# Patient Record
Sex: Female | Born: 1955 | Race: White | Hispanic: No | Marital: Married | Smoking: Never smoker
Health system: Southern US, Community
[De-identification: ages and names within clinical notes are randomized; demographics above are authoritative.]

## PROBLEM LIST (undated history)

## (undated) DIAGNOSIS — K5090 Crohn's disease, unspecified, without complications: Secondary | ICD-10-CM

## (undated) DIAGNOSIS — C801 Malignant (primary) neoplasm, unspecified: Secondary | ICD-10-CM

## (undated) DIAGNOSIS — E042 Nontoxic multinodular goiter: Secondary | ICD-10-CM

## (undated) DIAGNOSIS — D509 Iron deficiency anemia, unspecified: Secondary | ICD-10-CM

## (undated) HISTORY — DX: Iron deficiency anemia, unspecified: D50.9

## (undated) NOTE — *Deleted (*Deleted)
[facility]  [address] [place], [place] [address] Phone: [phone]  Fax: [phone]   Clinic Day:  11/24/2019  Referring physician: Gayland Curry, MD  Chief Complaint: Donna Hart is a 68 y.o. female with Crohn's disease and iron deficiency anemia who is seen for 1 year assessment.   HPI:  The patient was last seen in the hematology clinic on 11/05/2018 for new patient assessment. At that time, she has been fatigued x 3 weeks.  Exam is unremarkable.  Hematocrit 38.7, hemoglobin 12.2, MCV 83, platelets 323,000, WBC 8,900.  Ferritin was 9 with an iron saturation of 6% and a TIBC of 467. She received Feraheme.  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 There were few shallow ulcers in the body of the pouch. There was a 4 cm segment of moderate inflammation in the pre-pouch ileum just above the pouch inlet (this looked worse than prior).   Screening mammogram on 06/11/2019 revealed no evidence of malignancy.  Labs followed: 02/10/2019: Hematocrit 41.2, hemoglobin 13.1, platelets 296,000, WBC 9,900. Ferritin 27. Iron saturation 12%. TIBC 385. 05/06/2019: Hematocrit 41.5, hemoglobin 13.1, platelets 292,000, WBC 6,500. Ferritin 20. Iron saturation   9%. TIBC 400. 09/24/2019: Hematocrit 39.2, hemoglobin 12.1, platelets 358,000, WBC 8,200. Ferritin 13.  Additional labs: Vitamin B12 was 458 and folate was 35.0 on 02/10/2019.  She received Feraheme on 09/30/2019.  During the interim, ***   Past Medical History:  Diagnosis Date  . Cancer ([place])    thyroid  . Crohn's disease ([place])   . IDA (iron deficiency anemia)   . Multinodular goiter     Past Surgical History:  Procedure Laterality Date  . CESAREAN SECTION      Family History  Problem Relation Age of Onset  . Breast cancer Maternal Aunt 46  Her father was suspected to have an autoimmune disease. Her sister has ulcerative colitis.    Social History:  reports that  she has never smoked. She has never used smokeless tobacco. She reports previous alcohol use. She reports that she does not use drugs.  She drinks about 2 glasses of wine a week. She denies any exposure to radiations or toxins. 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.  She lives in [place].  The patient is alone*** today.  Allergies:  Allergies  Allergen Reactions  . Levofloxacin Other (See Comments)    Achilles tendon pain  . Ciprofloxacin Palpitations    Heart Palpitations  . Morphine Rash    Current Medications: Current Outpatient Medications  Medication Sig Dispense Refill  . acetaminophen (TYLENOL) 500 MG tablet Take 1,000 mg by mouth every 6 (six) hours as needed.     Marland Kitchen amLODipine (NORVASC) 5 MG tablet Take 1 tablet by mouth daily.  11  . BIOTIN PO Take 1 tablet by mouth daily.    . Calcium Carbonate-Vitamin D 600-400 MG-UNIT tablet Take 1 tablet by mouth daily.     . Celecoxib (CELEBREX PO) Take by mouth.    . doxycycline (VIBRA-TABS) 100 MG tablet Take 1 tablet (100 mg total) by mouth 2 (two) times daily. 20 tablet 0  . levothyroxine (SYNTHROID) 137 MCG tablet Take 137 mcg by mouth daily.    Marland Kitchen lisinopril (PRINIVIL,ZESTRIL) 10 MG tablet Take 10 mg by mouth daily.     . Multiple Vitamins-Minerals (MULTIVITAMIN ADULT PO) Take 1 tablet by mouth daily.    . NEOMYCIN-POLYMYXIN-HYDROCORTISONE (CORTISPORIN) 1 % SOLN OTIC solution Apply 1-2 drops to toe BID  after soaking 10 mL 1  . omeprazole (PRILOSEC) 20 MG capsule Take 20 mg by mouth daily.    Marland Kitchen Ustekinumab (STELARA ) Inject 90 mg into the skin every 8 (eight) weeks.      No current facility-administered medications for this visit.    Review of Systems  Constitutional: Positive for malaise/fatigue (x 3-4 weeks). Negative for chills, diaphoresis, fever and weight loss.       Feels "pretty good".  HENT: Negative.  Negative for congestion, ear pain, hearing loss, nosebleeds, sinus pain and sore throat.    Eyes: Negative.  Negative for blurred vision and double vision.  Respiratory: Positive for shortness of breath. Negative for cough, hemoptysis and sputum production.   Cardiovascular: Negative.  Negative for chest pain, palpitations, claudication, leg swelling and PND.  Gastrointestinal: Negative for abdominal pain, blood in stool, constipation, diarrhea, heartburn, melena, nausea and vomiting.       Chron's disease. "Pasty stool" s/p J-pouch surgery (2003).  Genitourinary: Negative for dysuria, frequency, hematuria and urgency.  Musculoskeletal: Positive for joint pain (hips). Negative for back pain, myalgias and neck pain.       Muscle cramps.  Skin: Negative.  Negative for itching and rash.  Neurological: Negative.  Negative for dizziness, tingling, sensory change, focal weakness, weakness and headaches.  Endo/Heme/Allergies: Negative.  Does not bruise/bleed easily.  Psychiatric/Behavioral: Negative.  Negative for depression and memory loss. The patient is not nervous/anxious and does not have insomnia.   All other systems reviewed and are negative.  Performance status (ECOG): 1***  Vitals There were no vitals taken for this visit.  Physical Exam Vitals and nursing note reviewed.  Constitutional:      General: She is not in acute distress.    Appearance: She is well-developed. She is not diaphoretic.  HENT:     Head: Normocephalic and atraumatic.     Mouth/Throat:     Pharynx: No oropharyngeal exudate.  Eyes:     General: No scleral icterus.    Conjunctiva/sclera: Conjunctivae normal.     Pupils: Pupils are equal, round, and reactive to light.     Comments: Glasses.  Blue eyes.  Cardiovascular:     Rate and Rhythm: Normal rate and regular rhythm.     Heart sounds: Normal heart sounds. No murmur heard.  No gallop.   Pulmonary:     Effort: Pulmonary effort is normal. No respiratory distress.     Breath sounds: Normal breath sounds. No wheezing or rales.  Chest:     Chest  wall: No tenderness.  Abdominal:     General: Bowel sounds are normal. There is no distension.     Palpations: Abdomen is soft. There is no mass.     Tenderness: There is no abdominal tenderness. There is no guarding or rebound.  Musculoskeletal:        General: No tenderness. Normal range of motion.     Cervical back: Normal range of motion and neck supple.  Lymphadenopathy:     Head:     Right side of head: No preauricular, posterior auricular or occipital adenopathy.     Left side of head: No preauricular, posterior auricular or occipital adenopathy.     Cervical: No cervical adenopathy.     Upper Body:     Right upper body: No supraclavicular adenopathy.     Left upper body: No supraclavicular adenopathy.     Lower Body: No right inguinal adenopathy. No left inguinal adenopathy.  Skin:    General:  Skin is warm and dry.     Coloration: Skin is not pale.     Findings: No erythema or rash.  Neurological:     Mental Status: She is alert and oriented to person, place, and time.  Psychiatric:        Behavior: Behavior normal.        Thought Content: Thought content normal.        Judgment: Judgment normal.     No visits with results within 3 Day(s) from this visit.  Latest known visit with results is:  Appointment on 09/24/2019  Component Date Value Ref Range Status  . Ferritin 09/24/2019 13  11 - 307 ng/mL Final   Performed at [facility], [address] [place], [place] [address]  . WBC 09/24/2019 8.2  4.0 - 10.5 K/uL Final  . RBC 09/24/2019 4.70  3.87 - 5.11 MIL/uL Final  . Hemoglobin 09/24/2019 12.1  12.0 - 15.0 g/dL Final  . HCT 09/24/2019 39.2  36 - 46 % Final  . MCV 09/24/2019 83.4  80.0 - 100.0 fL Final  . MCH 09/24/2019 25.7* 26.0 - 34.0 pg Final  . MCHC 09/24/2019 30.9  30.0 - 36.0 g/dL Final  . RDW 09/24/2019 15.1  11.5 - 15.5 % Final  . Platelets 09/24/2019 358  150 - 400 K/uL Final  . nRBC 09/24/2019 0.0  0.0 - 0.2 % Final   Performed at [facility], [address], [place], [place] [address]    Assessment:  Karalynn Cottone is a 49 y.o. female with Crohn's disease and recurrent iron deficiency anemia.  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  She has received Feraheme on 07/26/2015, 01/30/2016, 01/04/2017, 01/09/2017, 03/28/2017, 10/18/2017, 11/05/2018, and 09/30/2019.  Ferritin has been followed: 9 on 01/19/2011, 20 on 04/25/2011, 15 on 07/25/2011, 9 on 10/24/2011, 11 on 01/30/2012,  10 on 04/04/2015, 22 on 07/02/2013, 11 on 07/19/2015, 19 on 01/18/2016, 16 on 04/25/2016, 8 on 12/26/2016, 33 on 04/04/2017, 45 on 07/03/2017, 24 on 09/30/2017, 9 on 11/04/2018, 27 on 02/10/2019, 20 on 05/06/2019, and 13 on 09/24/2019.   Pouchoscopy at [facility] on 01/22/2018 revealed anal stricture on digital rectal exam.  Prepouch ileum was normal.  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  There was inflammation at the pouch inlet secondary to pouchitis.  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 There were few shallow ulcers in the body of the pouch. There was a 4 cm segment of moderate inflammation in the pre-pouch ileum just above the pouch inlet (this looked worse than prior).   Symptomatically, ***  Plan: 1.   Review labs   2.   Iron deficiency anemia  Review entire medical history, diagnosis and management of iron deficiency anemia.  Etiology felt secondary to malabsorption.  Patient to follow-up with GI for repeat post-surgical lower GI endoscopy in 6 months (07/23/2018).  Feraheme today. 3.   RTC in 3 months for labs (CBC, ferritin, iron studies, B12, folate). 4.   RTC in 6 months for labs (CBC, ferritin, iron studies). 5.   RTC in 1 year for MD assessment, labs (CBC with diff, ferritin, iron studies- day before), and +/- Feraheme.  I discussed the assessment and treatment plan with the patient.  The patient was  provided an opportunity to ask questions and all were answered.  The patient agreed with the plan and demonstrated an understanding of the instructions.  The patient  was advised to call back if the symptoms worsen or if the condition fails to improve as anticipated.  I provided *** minutes of face-to-face time during this this encounter and > 50% was spent counseling as documented under my assessment and plan.  Melissa C. Mike Gip, MD, PhD    11/24/2019, 9:14 AM  I, Mirian Mo Tufford, am acting as Education administrator for [employer]. Mike Gip, MD, PhD.  I, Melissa C. Mike Gip, MD, have reviewed the above documentation for accuracy and completeness, and I agree with the above.

---

## 2005-12-27 ENCOUNTER — Ambulatory Visit: Payer: Self-pay | Admitting: Family Medicine

## 2007-02-25 ENCOUNTER — Other Ambulatory Visit: Payer: Self-pay

## 2007-02-25 ENCOUNTER — Emergency Department: Payer: Self-pay | Admitting: Emergency Medicine

## 2008-02-11 ENCOUNTER — Ambulatory Visit: Payer: Self-pay | Admitting: Family Medicine

## 2008-03-08 ENCOUNTER — Ambulatory Visit: Payer: Self-pay | Admitting: Family Medicine

## 2008-08-24 ENCOUNTER — Ambulatory Visit: Payer: Self-pay

## 2008-10-06 ENCOUNTER — Ambulatory Visit: Payer: Self-pay | Admitting: Internal Medicine

## 2008-10-08 ENCOUNTER — Ambulatory Visit: Payer: Self-pay | Admitting: Internal Medicine

## 2008-10-11 ENCOUNTER — Ambulatory Visit: Payer: Self-pay

## 2008-10-20 ENCOUNTER — Ambulatory Visit: Payer: Self-pay | Admitting: Family Medicine

## 2008-11-08 ENCOUNTER — Ambulatory Visit: Payer: Self-pay | Admitting: Oncology

## 2008-11-08 ENCOUNTER — Ambulatory Visit: Payer: Self-pay | Admitting: Internal Medicine

## 2008-12-08 ENCOUNTER — Ambulatory Visit: Payer: Self-pay | Admitting: Oncology

## 2008-12-08 ENCOUNTER — Ambulatory Visit: Payer: Self-pay | Admitting: Internal Medicine

## 2009-01-08 ENCOUNTER — Ambulatory Visit: Payer: Self-pay | Admitting: Internal Medicine

## 2009-02-08 ENCOUNTER — Ambulatory Visit: Payer: Self-pay | Admitting: Internal Medicine

## 2009-04-01 ENCOUNTER — Ambulatory Visit: Payer: Self-pay | Admitting: Internal Medicine

## 2009-04-08 ENCOUNTER — Ambulatory Visit: Payer: Self-pay | Admitting: Internal Medicine

## 2009-05-08 ENCOUNTER — Ambulatory Visit: Payer: Self-pay | Admitting: Internal Medicine

## 2009-06-08 ENCOUNTER — Ambulatory Visit: Payer: Self-pay | Admitting: Internal Medicine

## 2009-06-08 ENCOUNTER — Ambulatory Visit: Payer: Self-pay | Admitting: Oncology

## 2009-07-08 ENCOUNTER — Ambulatory Visit: Payer: Self-pay | Admitting: Internal Medicine

## 2009-07-08 ENCOUNTER — Ambulatory Visit: Payer: Self-pay | Admitting: Oncology

## 2009-09-08 ENCOUNTER — Ambulatory Visit: Payer: Self-pay | Admitting: Internal Medicine

## 2009-09-16 ENCOUNTER — Ambulatory Visit: Payer: Self-pay | Admitting: Internal Medicine

## 2009-10-08 ENCOUNTER — Ambulatory Visit: Payer: Self-pay | Admitting: Internal Medicine

## 2009-11-30 ENCOUNTER — Ambulatory Visit: Payer: Self-pay | Admitting: Oncology

## 2009-12-08 ENCOUNTER — Ambulatory Visit: Payer: Self-pay | Admitting: Oncology

## 2010-01-08 ENCOUNTER — Ambulatory Visit: Payer: Self-pay | Admitting: Oncology

## 2010-03-09 ENCOUNTER — Ambulatory Visit: Payer: Self-pay | Admitting: Internal Medicine

## 2010-04-09 ENCOUNTER — Ambulatory Visit: Payer: Self-pay | Admitting: Internal Medicine

## 2010-05-09 ENCOUNTER — Ambulatory Visit: Payer: Self-pay | Admitting: Oncology

## 2010-07-28 ENCOUNTER — Ambulatory Visit: Payer: Self-pay | Admitting: Oncology

## 2010-08-09 ENCOUNTER — Ambulatory Visit: Payer: Self-pay | Admitting: Oncology

## 2010-09-09 ENCOUNTER — Ambulatory Visit: Payer: Self-pay | Admitting: Oncology

## 2011-01-19 ENCOUNTER — Ambulatory Visit: Payer: Self-pay | Admitting: Oncology

## 2011-01-19 LAB — CBC CANCER CENTER
Basophil %: 0.3 %
Eosinophil %: 1.6 %
HCT: 38.2 % (ref 35.0–47.0)
HGB: 12.3 g/dL (ref 12.0–16.0)
Lymphocyte #: 1.8 x10 3/mm (ref 1.0–3.6)
Lymphocyte %: 22 %
MCHC: 32.3 g/dL (ref 32.0–36.0)
MCV: 84.9 fL (ref 80–100)
Monocyte %: 8.4 %
Neutrophil #: 5.6 x10 3/mm (ref 1.4–6.5)
RBC: 4.5 10*6/uL (ref 3.80–5.20)
RDW: 13.3 % (ref 11.5–14.5)
WBC: 8.2 x10 3/mm (ref 3.6–11.0)

## 2011-01-19 LAB — FERRITIN: Ferritin (ARMC): 9 ng/mL (ref 8–388)

## 2011-01-19 LAB — IRON AND TIBC: Iron Saturation: 6 %

## 2011-02-09 ENCOUNTER — Ambulatory Visit: Payer: Self-pay | Admitting: Oncology

## 2011-04-25 ENCOUNTER — Ambulatory Visit: Payer: Self-pay | Admitting: Oncology

## 2011-04-25 LAB — CBC CANCER CENTER
Basophil #: 0.1 x10 3/mm (ref 0.0–0.1)
Basophil %: 0.6 %
Eosinophil #: 0.1 x10 3/mm (ref 0.0–0.7)
Eosinophil %: 1.3 %
HCT: 41.4 % (ref 35.0–47.0)
Lymphocyte #: 1.8 x10 3/mm (ref 1.0–3.6)
Lymphocyte %: 21 %
MCHC: 33.2 g/dL (ref 32.0–36.0)
MCV: 87 fL (ref 80–100)
Monocyte #: 0.8 x10 3/mm (ref 0.2–0.9)
Monocyte %: 9 %
Neutrophil #: 6 x10 3/mm (ref 1.4–6.5)

## 2011-04-26 LAB — IRON AND TIBC
Iron Saturation: 20 %
Iron: 77 ug/dL (ref 50–170)

## 2011-04-26 LAB — FERRITIN: Ferritin (ARMC): 20 ng/mL (ref 8–388)

## 2011-05-09 ENCOUNTER — Ambulatory Visit: Payer: Self-pay | Admitting: Oncology

## 2011-07-25 ENCOUNTER — Ambulatory Visit: Payer: Self-pay | Admitting: Oncology

## 2011-07-25 LAB — CBC CANCER CENTER
Basophil #: 0 x10 3/mm (ref 0.0–0.1)
Basophil %: 0.4 %
Eosinophil %: 1.3 %
HCT: 41.1 % (ref 35.0–47.0)
Lymphocyte #: 2.1 x10 3/mm (ref 1.0–3.6)
MCH: 29.5 pg (ref 26.0–34.0)
MCHC: 33.9 g/dL (ref 32.0–36.0)
Monocyte %: 8.4 %
Neutrophil #: 5.4 x10 3/mm (ref 1.4–6.5)

## 2011-07-25 LAB — FERRITIN: Ferritin (ARMC): 15 ng/mL (ref 8–388)

## 2011-07-25 LAB — IRON AND TIBC
Iron Saturation: 37 %
Iron: 164 ug/dL (ref 50–170)
Unbound Iron-Bind.Cap.: 279 ug/dL

## 2011-08-09 ENCOUNTER — Ambulatory Visit: Payer: Self-pay | Admitting: Oncology

## 2011-10-24 ENCOUNTER — Ambulatory Visit: Payer: Self-pay | Admitting: Oncology

## 2011-10-24 LAB — IRON AND TIBC
Iron Bind.Cap.(Total): 442 ug/dL (ref 250–450)
Iron: 113 ug/dL (ref 50–170)
Unbound Iron-Bind.Cap.: 329 ug/dL

## 2011-10-24 LAB — CBC CANCER CENTER
Eosinophil %: 2.1 %
HCT: 40 % (ref 35.0–47.0)
Lymphocyte #: 1.9 x10 3/mm (ref 1.0–3.6)
Lymphocyte %: 25.6 %
MCV: 87 fL (ref 80–100)
Monocyte %: 9.9 %
Neutrophil #: 4.5 x10 3/mm (ref 1.4–6.5)
Neutrophil %: 61.9 %
Platelet: 285 x10 3/mm (ref 150–440)
RBC: 4.61 10*6/uL (ref 3.80–5.20)
RDW: 14.1 % (ref 11.5–14.5)

## 2011-11-09 ENCOUNTER — Ambulatory Visit: Payer: Self-pay | Admitting: Oncology

## 2011-12-24 DIAGNOSIS — E042 Nontoxic multinodular goiter: Secondary | ICD-10-CM | POA: Insufficient documentation

## 2012-01-30 ENCOUNTER — Ambulatory Visit: Payer: Self-pay | Admitting: Oncology

## 2012-01-30 LAB — CBC CANCER CENTER
Basophil #: 0.1 x10 3/mm (ref 0.0–0.1)
Basophil %: 0.7 %
Eosinophil #: 0.1 x10 3/mm (ref 0.0–0.7)
Eosinophil %: 2 %
HCT: 39.5 % (ref 35.0–47.0)
Lymphocyte #: 1.9 x10 3/mm (ref 1.0–3.6)
Lymphocyte %: 25.9 %
MCHC: 33 g/dL (ref 32.0–36.0)
Monocyte #: 0.8 x10 3/mm (ref 0.2–0.9)
Neutrophil #: 4.4 x10 3/mm (ref 1.4–6.5)
Platelet: 286 x10 3/mm (ref 150–440)
RDW: 15.2 % — ABNORMAL HIGH (ref 11.5–14.5)

## 2012-01-30 LAB — IRON AND TIBC
Iron Saturation: 20 %
Iron: 87 ug/dL (ref 50–170)

## 2012-01-30 LAB — FERRITIN: Ferritin (ARMC): 11 ng/mL (ref 8–388)

## 2012-02-09 ENCOUNTER — Ambulatory Visit: Payer: Self-pay | Admitting: Oncology

## 2012-08-13 DIAGNOSIS — S3993XA Unspecified injury of pelvis, initial encounter: Secondary | ICD-10-CM | POA: Insufficient documentation

## 2012-08-20 ENCOUNTER — Ambulatory Visit: Payer: Self-pay | Admitting: Oncology

## 2012-09-08 ENCOUNTER — Ambulatory Visit: Payer: Self-pay | Admitting: Oncology

## 2012-12-24 DIAGNOSIS — L0291 Cutaneous abscess, unspecified: Secondary | ICD-10-CM | POA: Insufficient documentation

## 2013-01-21 DIAGNOSIS — E079 Disorder of thyroid, unspecified: Secondary | ICD-10-CM | POA: Insufficient documentation

## 2013-04-07 ENCOUNTER — Ambulatory Visit: Payer: Self-pay | Admitting: Oncology

## 2013-04-09 ENCOUNTER — Ambulatory Visit: Payer: Self-pay | Admitting: Oncology

## 2013-05-08 ENCOUNTER — Ambulatory Visit: Payer: Self-pay | Admitting: Oncology

## 2013-07-02 ENCOUNTER — Ambulatory Visit: Payer: Self-pay | Admitting: Oncology

## 2013-07-02 LAB — CBC CANCER CENTER
BASOS ABS: 0.1 x10 3/mm (ref 0.0–0.1)
BASOS PCT: 0.8 %
EOS ABS: 0.2 x10 3/mm (ref 0.0–0.7)
Eosinophil %: 2.2 %
HCT: 43.2 % (ref 35.0–47.0)
HGB: 14.2 g/dL (ref 12.0–16.0)
Lymphocyte #: 1.9 x10 3/mm (ref 1.0–3.6)
Lymphocyte %: 25.5 %
MCH: 29.1 pg (ref 26.0–34.0)
MCHC: 33 g/dL (ref 32.0–36.0)
MCV: 88 fL (ref 80–100)
Monocyte #: 0.8 x10 3/mm (ref 0.2–0.9)
Monocyte %: 11.2 %
Neutrophil #: 4.6 x10 3/mm (ref 1.4–6.5)
Neutrophil %: 60.3 %
PLATELETS: 276 x10 3/mm (ref 150–440)
RBC: 4.89 10*6/uL (ref 3.80–5.20)
RDW: 14.8 % — ABNORMAL HIGH (ref 11.5–14.5)
WBC: 7.5 x10 3/mm (ref 3.6–11.0)

## 2013-07-02 LAB — FERRITIN: FERRITIN (ARMC): 22 ng/mL (ref 8–388)

## 2013-07-02 LAB — IRON AND TIBC
Iron Bind.Cap.(Total): 369 ug/dL (ref 250–450)
Iron Saturation: 16 %
Iron: 59 ug/dL (ref 50–170)
Unbound Iron-Bind.Cap.: 310 ug/dL

## 2013-07-08 ENCOUNTER — Ambulatory Visit: Payer: Self-pay | Admitting: Oncology

## 2013-10-20 DIAGNOSIS — K91850 Pouchitis: Secondary | ICD-10-CM | POA: Insufficient documentation

## 2013-12-01 DIAGNOSIS — K51919 Ulcerative colitis, unspecified with unspecified complications: Secondary | ICD-10-CM | POA: Insufficient documentation

## 2015-04-04 ENCOUNTER — Other Ambulatory Visit: Payer: Self-pay | Admitting: Family Medicine

## 2015-04-04 ENCOUNTER — Ambulatory Visit
Admission: RE | Admit: 2015-04-04 | Discharge: 2015-04-04 | Disposition: A | Discharge status: Home / Self Care | Payer: BLUE CROSS/BLUE SHIELD | Source: Ambulatory Visit | Attending: Family Medicine | Admitting: Family Medicine

## 2015-04-04 DIAGNOSIS — Z1231 Encounter for screening mammogram for malignant neoplasm of breast: Secondary | ICD-10-CM | POA: Diagnosis not present

## 2015-05-20 DIAGNOSIS — I10 Essential (primary) hypertension: Secondary | ICD-10-CM | POA: Insufficient documentation

## 2015-07-19 ENCOUNTER — Other Ambulatory Visit: Payer: BLUE CROSS/BLUE SHIELD

## 2015-07-19 ENCOUNTER — Other Ambulatory Visit: Payer: Self-pay | Admitting: *Deleted

## 2015-07-19 ENCOUNTER — Inpatient Hospital Stay: Payer: BLUE CROSS/BLUE SHIELD | Attending: Oncology

## 2015-07-19 DIAGNOSIS — D509 Iron deficiency anemia, unspecified: Secondary | ICD-10-CM | POA: Insufficient documentation

## 2015-07-19 DIAGNOSIS — Z803 Family history of malignant neoplasm of breast: Secondary | ICD-10-CM | POA: Diagnosis not present

## 2015-07-19 DIAGNOSIS — R5383 Other fatigue: Secondary | ICD-10-CM | POA: Insufficient documentation

## 2015-07-19 DIAGNOSIS — K921 Melena: Secondary | ICD-10-CM | POA: Diagnosis not present

## 2015-07-19 DIAGNOSIS — R0602 Shortness of breath: Secondary | ICD-10-CM | POA: Diagnosis not present

## 2015-07-19 DIAGNOSIS — Z79899 Other long term (current) drug therapy: Secondary | ICD-10-CM | POA: Diagnosis not present

## 2015-07-19 DIAGNOSIS — K50911 Crohn's disease, unspecified, with rectal bleeding: Secondary | ICD-10-CM | POA: Insufficient documentation

## 2015-07-19 DIAGNOSIS — R531 Weakness: Secondary | ICD-10-CM | POA: Diagnosis not present

## 2015-07-19 LAB — CBC WITH DIFFERENTIAL/PLATELET
BASOS ABS: 0 10*3/uL (ref 0–0.1)
BASOS PCT: 1 %
EOS PCT: 4 %
Eosinophils Absolute: 0.3 10*3/uL (ref 0–0.7)
HEMATOCRIT: 39.3 % (ref 35.0–47.0)
Hemoglobin: 12.8 g/dL (ref 12.0–16.0)
LYMPHS PCT: 28 %
Lymphs Abs: 1.7 10*3/uL (ref 1.0–3.6)
MCH: 28.2 pg (ref 26.0–34.0)
MCHC: 32.4 g/dL (ref 32.0–36.0)
MCV: 86.8 fL (ref 80.0–100.0)
Monocytes Absolute: 0.7 10*3/uL (ref 0.2–0.9)
Monocytes Relative: 11 %
NEUTROS ABS: 3.6 10*3/uL (ref 1.4–6.5)
Neutrophils Relative %: 56 %
PLATELETS: 260 10*3/uL (ref 150–440)
RBC: 4.53 MIL/uL (ref 3.80–5.20)
RDW: 14.7 % — AB (ref 11.5–14.5)
WBC: 6.3 10*3/uL (ref 3.6–11.0)

## 2015-07-19 LAB — IRON AND TIBC
IRON: 48 ug/dL (ref 28–170)
Saturation Ratios: 11 % (ref 10.4–31.8)
TIBC: 421 ug/dL (ref 250–450)
UIBC: 373 ug/dL

## 2015-07-19 LAB — FERRITIN: Ferritin: 11 ng/mL (ref 11–307)

## 2015-07-20 ENCOUNTER — Inpatient Hospital Stay: Payer: BLUE CROSS/BLUE SHIELD | Admitting: Oncology

## 2015-07-20 ENCOUNTER — Inpatient Hospital Stay: Payer: BLUE CROSS/BLUE SHIELD

## 2015-07-25 ENCOUNTER — Encounter: Payer: Self-pay | Admitting: Oncology

## 2015-07-25 ENCOUNTER — Inpatient Hospital Stay (HOSPITAL_BASED_OUTPATIENT_CLINIC_OR_DEPARTMENT_OTHER): Payer: BLUE CROSS/BLUE SHIELD | Admitting: Oncology

## 2015-07-25 VITALS — BP 152/87 | HR 76 | Temp 97.9°F | Resp 18 | Wt 288.7 lb

## 2015-07-25 DIAGNOSIS — Z803 Family history of malignant neoplasm of breast: Secondary | ICD-10-CM

## 2015-07-25 DIAGNOSIS — R0602 Shortness of breath: Secondary | ICD-10-CM

## 2015-07-25 DIAGNOSIS — R5383 Other fatigue: Secondary | ICD-10-CM

## 2015-07-25 DIAGNOSIS — D509 Iron deficiency anemia, unspecified: Secondary | ICD-10-CM | POA: Insufficient documentation

## 2015-07-25 DIAGNOSIS — Z79899 Other long term (current) drug therapy: Secondary | ICD-10-CM

## 2015-07-25 DIAGNOSIS — R531 Weakness: Secondary | ICD-10-CM | POA: Diagnosis not present

## 2015-07-25 DIAGNOSIS — K50911 Crohn's disease, unspecified, with rectal bleeding: Secondary | ICD-10-CM

## 2015-07-25 DIAGNOSIS — K921 Melena: Secondary | ICD-10-CM

## 2015-07-25 NOTE — Progress Notes (Signed)
[facility]  [phone] [phone]  ID: Donna Hart OB: 05/16/1955  MR#: [id number]  QPY#:[id number]  Patient Care Team: Gayland Curry, MD as PCP - General (Family Medicine)  CHIEF COMPLAINT: Iron deficiency anemia.  INTERVAL HISTORY: Patient has not been evaluated in clinic and nearly 2 years. She returns to clinic today with cmplaints of increased weakness and fatigue as well as occasional shortness of breath. 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  She denies any chest pain.  She denies any easy bleeding or bruising.  She has no nausea, vomiting, constipation, or diarrhea. She has Crohn's disease therefore does have occasional hematochezia. She has good appetite and has maintained her weight. Patient offers no further specific complaints today.  REVIEW OF SYSTEMS:   Review of Systems  Constitutional: Positive for malaise/fatigue. Negative for fever and weight loss.  Respiratory: Positive for shortness of breath.   Cardiovascular: Negative.  Negative for chest pain.  Gastrointestinal: Positive for blood in stool. Negative for abdominal pain and melena.  Genitourinary: Negative.   Musculoskeletal: Negative.   Neurological: Positive for weakness.  Psychiatric/Behavioral: Negative.     As per HPI. Otherwise, a complete review of systems is negatve.  PAST MEDICAL HISTORY: Past Medical History  Diagnosis Date  . IDA (iron deficiency anemia)     PAST SURGICAL HISTORY: Past Surgical History  Procedure Laterality Date  . Cesarean section      FAMILY HISTORY: Family History  Problem Relation Age of Onset  . Breast cancer Maternal Aunt     mat great aunt       ADVANCED DIRECTIVES:    HEALTH MAINTENANCE: Social History  Substance Use Topics  . Smoking status: Never Smoker   . Smokeless tobacco: Not on file  . Alcohol Use: Not on file     Colonoscopy:  PAP:  Bone density:  Lipid panel:  Allergies    Allergen Reactions  . Levofloxacin Other (See Comments)    Achilles tendon pain  . Ciprofloxacin Palpitations    Heart Palpitations  . Morphine Rash    Current Outpatient Prescriptions  Medication Sig Dispense Refill  . Multiple Vitamins-Minerals (MULTIVITAMIN ADULT PO) Take 1 tablet by mouth daily.    Marland Kitchen omeprazole (PRILOSEC) 20 MG capsule Take 20 mg by mouth daily.    . TURMERIC PO Take 1 tablet by mouth 2 (two) times daily.    Marland Kitchen amLODipine (NORVASC) 5 MG tablet Take 1 tablet by mouth daily.  11  . BIOTIN PO Take 1 tablet by mouth daily.    . Calcium Carbonate-Vitamin D 600-400 MG-UNIT tablet Take 1 tablet by mouth 2 (two) times daily.    . Golimumab 50 MG/0.5ML SOAJ Inject 1 Dose into the skin every 30 (thirty) days.     No current facility-administered medications for this visit.    OBJECTIVE: Filed Vitals:   07/25/15 1131  BP: 152/87  Pulse: 76  Temp: 97.9 F (36.6 C)  Resp: 18     There is no height on file to calculate BMI.    ECOG FS:0 - Asymptomatic  General: Well-developed, well-nourished, no acute distress. Eyes: Pink conjunctiva, anicteric sclera. HEENT: Normocephalic, moist mucous membranes, clear oropharnyx. Lungs: Clear to auscultation bilaterally. Heart: Regular rate and rhythm. No rubs, murmurs, or gallops. Abdomen: Soft, nontender, nondistended. No organomegaly noted, normoactive bowel sounds. Musculoskeletal: No edema, cyanosis, or clubbing. Neuro: Alert, answering all questions appropriately. Cranial nerves grossly intact. Skin: No rashes or petechiae noted. Psych: Normal affect. Lymphatics:  No cervical, calvicular, axillary or inguinal LAD.   LAB RESULTS:  No results found for: NA, K, CL, CO2, GLUCOSE, BUN, CREATININE, CALCIUM, PROT, ALBUMIN, AST, ALT, ALKPHOS, BILITOT, GFRNONAA, GFRAA  Lab Results  Component Value Date   WBC 6.3 07/19/2015   NEUTROABS 3.6 07/19/2015   HGB 12.8 07/19/2015   HCT 39.3 07/19/2015   MCV 86.8 07/19/2015   PLT  260 07/19/2015   Lab Results  Component Value Date   IRON 48 07/19/2015   TIBC 421 07/19/2015   IRONPCTSAT 11 07/19/2015    Lab Results  Component Value Date   FERRITIN 11 07/19/2015     STUDIES: No results found.  ASSESSMENT: Iron deficiency anemia.  PLAN:    1.  Iron deficiency anemia:  Likely secondary to chronic malabsorption and Crohn's disease:  Although patient's hemoglobin is within normal limits, her ferritin and iron stores are borderline low and she a symptomatic. She will receive one infusion of 510 mg IV Feraheme today. Return to clinic in 6 months with repeat laboratory work and further evaluation. She may require maintenance Feraheme in the future.  2.  Crohn's Disease: Continue evaluation and treatment per GI.  Patient expressed understanding and was in agreement with this plan. She also understands that She can call clinic at any time with any questions, concerns, or complaints.    Lloyd Huger, MD   07/25/2015 11:48 AM

## 2015-07-25 NOTE — Progress Notes (Signed)
States is feeling weak, tired with occasional shortness of breath.

## 2015-07-26 ENCOUNTER — Inpatient Hospital Stay: Payer: BLUE CROSS/BLUE SHIELD

## 2015-07-26 VITALS — BP 132/72 | HR 84 | Temp 97.2°F | Resp 18

## 2015-07-26 DIAGNOSIS — D509 Iron deficiency anemia, unspecified: Secondary | ICD-10-CM | POA: Diagnosis not present

## 2015-07-26 MED ORDER — SODIUM CHLORIDE 0.9 % IV SOLN
Freq: Once | INTRAVENOUS | Status: AC
  Administered 2015-07-26: 16:00:00 via INTRAVENOUS
  Filled 2015-07-26: qty 1000

## 2015-07-26 MED ORDER — SODIUM CHLORIDE 0.9 % IV SOLN
510.0000 mg | Freq: Once | INTRAVENOUS | Status: AC
  Administered 2015-07-26: 510 mg via INTRAVENOUS
  Filled 2015-07-26: qty 17

## 2015-10-30 ENCOUNTER — Ambulatory Visit
Admission: EM | Admit: 2015-10-30 | Discharge: 2015-10-30 | Disposition: A | Discharge status: Home / Self Care | Payer: BLUE CROSS/BLUE SHIELD | Attending: Family Medicine | Admitting: Family Medicine

## 2015-10-30 ENCOUNTER — Ambulatory Visit (INDEPENDENT_AMBULATORY_CARE_PROVIDER_SITE_OTHER): Payer: BLUE CROSS/BLUE SHIELD

## 2015-10-30 DIAGNOSIS — J209 Acute bronchitis, unspecified: Secondary | ICD-10-CM

## 2015-10-30 HISTORY — DX: Nontoxic multinodular goiter: E04.2

## 2015-10-30 HISTORY — DX: Crohn's disease, unspecified, without complications: K50.90

## 2015-10-30 MED ORDER — DOXYCYCLINE HYCLATE 100 MG PO CAPS: 100.0000 mg | ORAL_CAPSULE | Freq: Two times a day (BID) | ORAL | 0 refills | Status: DC

## 2015-10-30 NOTE — Discharge Instructions (Signed)
This is likely viral.  Given the immunosuppression, go ahead and start the antibiotic.  Take care  Dr. Lacinda Axon

## 2015-10-30 NOTE — ED Provider Notes (Signed)
[facility]    CSN: [id number] Arrival date & time: 10/30/15  1212  History   Chief Complaint Chief Complaint  Patient presents with  . Cough   HPI 60 year old female with a history of Crohn's disease is currently on immunosuppressants presents with complaints of cough.  Patient states she's had cough since Thursday. She reports associated shortness of breath. Cough is mildly productive of green sputum. 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 She is concerned about underlying pneumonia. No associated fevers or chills. She does note some fatigue. No known exacerbating or relieving factors. No other complaints this time.  Past Medical History:  Diagnosis Date  . Crohn's disease ([place])   . IDA (iron deficiency anemia)   . Multinodular goiter    Patient Active Problem List   Diagnosis Date Noted  . Iron deficiency anemia 07/25/2015   Past Surgical History:  Procedure Laterality Date  . CESAREAN SECTION      OB History    No data available      Home Medications    Prior to Admission medications   Medication Sig Start Date End Date Taking? Authorizing Provider  amLODipine (NORVASC) 5 MG tablet Take 1 tablet by mouth daily. 07/15/15  Yes Historical Provider, MD  BIOTIN PO Take 1 tablet by mouth daily.   Yes Historical Provider, MD  Calcium Carbonate-Vitamin D 600-400 MG-UNIT tablet Take 1 tablet by mouth 2 (two) times daily.   Yes Historical Provider, MD  Golimumab 50 MG/0.5ML SOAJ Inject 1 Dose into the skin every 30 (thirty) days.   Yes Historical Provider, MD  Multiple Vitamins-Minerals (MULTIVITAMIN ADULT PO) Take 1 tablet by mouth daily. 09/24/07  Yes Historical Provider, MD  omeprazole (PRILOSEC) 20 MG capsule Take 20 mg by mouth daily.   Yes Historical Provider, MD  TURMERIC PO Take 1 tablet by mouth 2 (two) times daily.   Yes Historical Provider, MD  doxycycline (VIBRAMYCIN) 100 MG capsule Take 1 capsule (100 mg  total) by mouth 2 (two) times daily. 10/30/15   Coral Spikes, DO    Family History Family History  Problem Relation Age of Onset  . Breast cancer Maternal Aunt     mat great aunt   Social History Social History  Substance Use Topics  . Smoking status: Never Smoker  . Smokeless tobacco: Never Used  . Alcohol use Not on file    Allergies   Levofloxacin; Ciprofloxacin; and Morphine   Review of Systems Review of Systems  Constitutional: Positive for fatigue.  Respiratory: Positive for cough and shortness of breath.   All other systems reviewed and are negative.  Physical Exam Triage Vital Signs ED Triage Vitals  Enc Vitals Group     BP 10/30/15 1323 (!) 169/83     Pulse Rate 10/30/15 1323 89     Resp 10/30/15 1323 18     Temp 10/30/15 1323 97.8 F (36.6 C)     Temp Source 10/30/15 1323 Oral     SpO2 10/30/15 1323 90 %     Weight 10/30/15 1322 250 lb (113.4 kg)     Height 10/30/15 1322 5' 3"  (1.6 m)     Head Circumference --      Peak Flow --      Pain Score 10/30/15 1324 0     Pain Loc --      Pain Edu? --      Excl. in [place]? --  Updated Vital Signs BP (!) 169/83 (BP Location: Left Arm)   Pulse 89   Temp 97.8 F (36.6 C) (Oral)   Resp 18   Ht 5' 3"  (1.6 m)   Wt 250 lb (113.4 kg)   SpO2 90%   BMI 44.29 kg/m   Physical Exam  Constitutional: She is oriented to person, place, and time. She appears well-developed. No distress.  HENT:  Head: Normocephalic and atraumatic.  Mouth/Throat: Oropharynx is clear and moist.  Eyes: Conjunctivae are normal.  Neck: Neck supple.  Cardiovascular: Normal rate and regular rhythm.   Pulmonary/Chest: Effort normal.  Decreased breath sounds in all fields. No adventitious sounds appreciated.  Abdominal: Soft. She exhibits no distension. There is no tenderness.  Musculoskeletal: Normal range of motion.  Neurological: She is alert and oriented to person, place, and time.  Skin: Skin is warm. No rash noted.  Psychiatric: She  has a normal mood and affect.  Vitals reviewed.  UC Treatments / Results  Labs (all labs ordered are listed, but only abnormal results are displayed) Labs Reviewed - No data to display  EKG  EKG Interpretation None       Radiology Dg Chest 2 View  Result Date: 10/30/2015 CLINICAL DATA:  60 year old female with history of cough for the past 3 days. EXAM: CHEST  2 VIEW COMPARISON:  Chest x-ray 02/25/2007. FINDINGS: Mild diffuse peribronchial cuffing. Lung volumes are normal. No consolidative airspace disease. No pleural effusions. No pneumothorax. No pulmonary nodule or mass noted. Pulmonary vasculature and the cardiomediastinal silhouette are within normal limits. Atherosclerosis in the thoracic aorta. IMPRESSION: 1. Mild diffuse peribronchial cuffing, suggestive of an acute bronchitis. 2. Aortic atherosclerosis. Electronically Signed   By: Vinnie Langton M.D.   On: 10/30/2015 14:07    Procedures Procedures (including critical care time)  Medications Ordered in UC Medications - No data to display  Initial Impression / Assessment and Plan / UC Course  I have reviewed the triage vital signs and the nursing notes.  Pertinent labs & imaging results that were available during my care of the patient were reviewed by me and considered in my medical decision making (see chart for details).  Clinical Course  60 year old female presents with complaints of cough. Given exposure and productive cough in the setting of immunosuppression, obtaining chest x-ray.  1420 - Chest x-ray returned revealing signs of acute bronchitis. No pneumonia.  Advised supportive care and use of doxycycline if she fails to improve or worsens.  Final Clinical Impressions(s) / UC Diagnoses   Final diagnoses:  Acute bronchitis, unspecified organism    New Prescriptions Discharge Medication List as of 10/30/2015  2:16 PM    START taking these medications   Details  doxycycline (VIBRAMYCIN) 100 MG capsule  Take 1 capsule (100 mg total) by mouth 2 (two) times daily., Starting Sun 10/30/2015, Normal         Coral Spikes, DO 10/30/15 1422

## 2015-10-30 NOTE — ED Triage Notes (Signed)
Pt c/o being lightheaded and cough since Friday.

## 2016-01-18 ENCOUNTER — Inpatient Hospital Stay: Payer: BLUE CROSS/BLUE SHIELD | Attending: Oncology

## 2016-01-18 DIAGNOSIS — K5090 Crohn's disease, unspecified, without complications: Secondary | ICD-10-CM | POA: Insufficient documentation

## 2016-01-18 DIAGNOSIS — E049 Nontoxic goiter, unspecified: Secondary | ICD-10-CM | POA: Insufficient documentation

## 2016-01-18 DIAGNOSIS — Z79899 Other long term (current) drug therapy: Secondary | ICD-10-CM | POA: Diagnosis not present

## 2016-01-18 DIAGNOSIS — R0602 Shortness of breath: Secondary | ICD-10-CM | POA: Insufficient documentation

## 2016-01-18 DIAGNOSIS — D509 Iron deficiency anemia, unspecified: Secondary | ICD-10-CM | POA: Insufficient documentation

## 2016-01-18 DIAGNOSIS — Z803 Family history of malignant neoplasm of breast: Secondary | ICD-10-CM | POA: Insufficient documentation

## 2016-01-18 LAB — CBC WITH DIFFERENTIAL/PLATELET
BASOS ABS: 0.1 10*3/uL (ref 0–0.1)
Basophils Relative: 1 %
Eosinophils Absolute: 0.3 10*3/uL (ref 0–0.7)
Eosinophils Relative: 4 %
HEMATOCRIT: 40.4 % (ref 35.0–47.0)
Hemoglobin: 13 g/dL (ref 12.0–16.0)
LYMPHS PCT: 31 %
Lymphs Abs: 3.1 10*3/uL (ref 1.0–3.6)
MCH: 26.1 pg (ref 26.0–34.0)
MCHC: 32.3 g/dL (ref 32.0–36.0)
MCV: 80.9 fL (ref 80.0–100.0)
MONO ABS: 1.1 10*3/uL — AB (ref 0.2–0.9)
Monocytes Relative: 11 %
NEUTROS ABS: 5.3 10*3/uL (ref 1.4–6.5)
Neutrophils Relative %: 53 %
Platelets: 322 10*3/uL (ref 150–440)
RBC: 4.99 MIL/uL (ref 3.80–5.20)
RDW: 15.5 % — AB (ref 11.5–14.5)
WBC: 9.9 10*3/uL (ref 3.6–11.0)

## 2016-01-18 LAB — FERRITIN: FERRITIN: 19 ng/mL (ref 11–307)

## 2016-01-18 LAB — IRON AND TIBC
Iron: 21 ug/dL — ABNORMAL LOW (ref 28–170)
Saturation Ratios: 5 % — ABNORMAL LOW (ref 10.4–31.8)
TIBC: 466 ug/dL — ABNORMAL HIGH (ref 250–450)
UIBC: 445 ug/dL

## 2016-01-20 ENCOUNTER — Ambulatory Visit: Payer: BLUE CROSS/BLUE SHIELD

## 2016-01-20 ENCOUNTER — Ambulatory Visit: Payer: BLUE CROSS/BLUE SHIELD | Admitting: Oncology

## 2016-01-25 ENCOUNTER — Other Ambulatory Visit: Payer: BLUE CROSS/BLUE SHIELD

## 2016-01-27 ENCOUNTER — Ambulatory Visit: Payer: BLUE CROSS/BLUE SHIELD

## 2016-01-27 ENCOUNTER — Ambulatory Visit: Payer: BLUE CROSS/BLUE SHIELD | Admitting: Oncology

## 2016-01-30 ENCOUNTER — Inpatient Hospital Stay (HOSPITAL_BASED_OUTPATIENT_CLINIC_OR_DEPARTMENT_OTHER): Payer: BLUE CROSS/BLUE SHIELD | Admitting: Oncology

## 2016-01-30 ENCOUNTER — Inpatient Hospital Stay: Payer: BLUE CROSS/BLUE SHIELD

## 2016-01-30 VITALS — BP 170/75 | HR 82 | Temp 99.1°F | Wt 267.0 lb

## 2016-01-30 DIAGNOSIS — R0602 Shortness of breath: Secondary | ICD-10-CM | POA: Diagnosis not present

## 2016-01-30 DIAGNOSIS — E049 Nontoxic goiter, unspecified: Secondary | ICD-10-CM

## 2016-01-30 DIAGNOSIS — D509 Iron deficiency anemia, unspecified: Secondary | ICD-10-CM

## 2016-01-30 DIAGNOSIS — K5090 Crohn's disease, unspecified, without complications: Secondary | ICD-10-CM

## 2016-01-30 DIAGNOSIS — D649 Anemia, unspecified: Secondary | ICD-10-CM | POA: Insufficient documentation

## 2016-01-30 DIAGNOSIS — D508 Other iron deficiency anemias: Secondary | ICD-10-CM

## 2016-01-30 DIAGNOSIS — Z803 Family history of malignant neoplasm of breast: Secondary | ICD-10-CM

## 2016-01-30 DIAGNOSIS — Z79899 Other long term (current) drug therapy: Secondary | ICD-10-CM

## 2016-01-30 MED ORDER — SODIUM CHLORIDE 0.9 % IV SOLN
Freq: Once | INTRAVENOUS | Status: AC
  Administered 2016-01-30: 15:00:00 via INTRAVENOUS
  Filled 2016-01-30: qty 1000

## 2016-01-30 MED ORDER — FERUMOXYTOL INJECTION 510 MG/17 ML
510.0000 mg | Freq: Once | INTRAVENOUS | Status: AC
  Administered 2016-01-30: 510 mg via INTRAVENOUS
  Filled 2016-01-30: qty 17

## 2016-01-30 NOTE — Progress Notes (Signed)
[place]  Telephone:(336) [phone] Fax:(336) [phone]  ID: Donna Hart OB: [birth year]  MR#: [id number]  KGU#:[id number]  Patient Care Team: Gayland Curry, MD as PCP - General (Family Medicine)  CHIEF COMPLAINT: Iron deficiency anemia.  INTERVAL HISTORY: Patient returns to clinic today for repeat laboratory work and further evaluation. She does not complain of weakness or fatigue today. She continues to have occasional dyspnea on exertion and shortness of breath. 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  She denies any chest pain.  She denies any easy bleeding or bruising.  She has no nausea, vomiting, constipation, or diarrhea. She has Crohn's disease therefore does have occasional hematochezia. She has a good appetite and has maintained her weight. Patient offers no further specific complaints today.  REVIEW OF SYSTEMS:   Review of Systems  Constitutional: Negative for fever, malaise/fatigue and weight loss.  Respiratory: Positive for shortness of breath.   Cardiovascular: Negative.  Negative for chest pain and leg swelling.  Gastrointestinal: Positive for blood in stool. Negative for abdominal pain and melena.  Genitourinary: Negative.   Musculoskeletal: Negative.   Neurological: Negative.  Negative for weakness.  Psychiatric/Behavioral: Negative.  The patient is not nervous/anxious.     As per HPI. Otherwise, a complete review of systems is negative.  PAST MEDICAL HISTORY: Past Medical History:  Diagnosis Date  . Crohn's disease ([place])   . IDA (iron deficiency anemia)   . Multinodular goiter     PAST SURGICAL HISTORY: Past Surgical History:  Procedure Laterality Date  . CESAREAN SECTION      FAMILY HISTORY: Family History  Problem Relation Age of Onset  . Breast cancer Maternal Aunt     mat great aunt       ADVANCED DIRECTIVES:    HEALTH MAINTENANCE: Social History  Substance Use Topics  . Smoking status: Never Smoker    . Smokeless tobacco: Never Used  . Alcohol use Not on file     Colonoscopy:  PAP:  Bone density:  Lipid panel:  Allergies  Allergen Reactions  . Levofloxacin Other (See Comments)    Achilles tendon pain  . Ciprofloxacin Palpitations    Heart Palpitations  . Morphine Rash    Current Outpatient Prescriptions  Medication Sig Dispense Refill  . acetaminophen (TYLENOL) 500 MG tablet Take 1,000 mg by mouth.    Marland Kitchen amLODipine (NORVASC) 5 MG tablet Take 1 tablet by mouth daily.  11  . BIOTIN PO Take 1 tablet by mouth daily.    . Calcium Carbonate-Vitamin D 600-400 MG-UNIT tablet Take 1 tablet by mouth 2 (two) times daily.    . Golimumab 50 MG/0.5ML SOAJ Inject 1 Dose into the skin every 30 (thirty) days.    . Multiple Vitamins-Minerals (MULTIVITAMIN ADULT PO) Take 1 tablet by mouth daily.    Marland Kitchen omeprazole (PRILOSEC) 20 MG capsule Take 20 mg by mouth daily.    . TURMERIC PO Take 1 tablet by mouth 2 (two) times daily.     No current facility-administered medications for this visit.     OBJECTIVE: Vitals:   01/30/16 1441  BP: (!) 170/75  Pulse: 82  Temp: 99.1 F (37.3 C)     Body mass index is 47.29 kg/m.    ECOG FS:0 - Asymptomatic  General: Well-developed, well-nourished, no acute distress. Eyes: Pink conjunctiva, anicteric sclera. Lungs: Clear to auscultation bilaterally. Heart: Regular rate and rhythm. No rubs, murmurs, or gallops. Abdomen: Soft, nontender, nondistended. No organomegaly noted, normoactive bowel sounds. Musculoskeletal: No  edema, cyanosis, or clubbing. Neuro: Alert, answering all questions appropriately. Cranial nerves grossly intact. Skin: No rashes or petechiae noted. Psych: Normal affect.   LAB RESULTS:  No results found for: NA, K, CL, CO2, GLUCOSE, BUN, CREATININE, CALCIUM, PROT, ALBUMIN, AST, ALT, ALKPHOS, BILITOT, GFRNONAA, GFRAA  Lab Results  Component Value Date   WBC 9.9 01/18/2016   NEUTROABS 5.3 01/18/2016   HGB 13.0 01/18/2016   HCT  40.4 01/18/2016   MCV 80.9 01/18/2016   PLT 322 01/18/2016   Lab Results  Component Value Date   IRON 21 (L) 01/18/2016   TIBC 466 (H) 01/18/2016   IRONPCTSAT 5 (L) 01/18/2016    Lab Results  Component Value Date   FERRITIN 19 01/18/2016     STUDIES: No results found.  ASSESSMENT: Iron deficiency anemia.  PLAN:    1.  Iron deficiency anemia:  Likely secondary to chronic malabsorption and Crohn's disease. Patient's hemoglobin continues to be within normal limits, and her iron stores continue to be slightly decreased. Proceed with one infusion of 510 mg IV Feraheme today. Return to clinic in 3 months with repeat laboratory work and further evaluation. She may require maintenance Feraheme in the future.  2.  Crohn's Disease: Continue evaluation and treatment per GI. Next line 3. Hypertension: Patient's blood pressure significantly elevated today. Continue monitoring and treatment per primary care.  Patient expressed understanding and was in agreement with this plan. She also understands that She can call clinic at any time with any questions, concerns, or complaints.    Lloyd Huger, MD   02/01/2016 8:58 AM

## 2016-01-30 NOTE — Progress Notes (Signed)
Patient denies pain or discomfort at this time, BP 170/75

## 2016-04-25 ENCOUNTER — Inpatient Hospital Stay: Payer: BLUE CROSS/BLUE SHIELD | Attending: Hematology and Oncology

## 2016-04-25 DIAGNOSIS — D509 Iron deficiency anemia, unspecified: Secondary | ICD-10-CM | POA: Insufficient documentation

## 2016-04-25 DIAGNOSIS — D508 Other iron deficiency anemias: Secondary | ICD-10-CM

## 2016-04-25 LAB — CBC WITH DIFFERENTIAL/PLATELET
BASOS PCT: 1 %
Basophils Absolute: 0.1 10*3/uL (ref 0–0.1)
EOS PCT: 3 %
Eosinophils Absolute: 0.2 10*3/uL (ref 0–0.7)
HEMATOCRIT: 42 % (ref 35.0–47.0)
Hemoglobin: 13.9 g/dL (ref 12.0–16.0)
Lymphocytes Relative: 27 %
Lymphs Abs: 2.4 10*3/uL (ref 1.0–3.6)
MCH: 28.1 pg (ref 26.0–34.0)
MCHC: 33.1 g/dL (ref 32.0–36.0)
MCV: 84.8 fL (ref 80.0–100.0)
MONO ABS: 1 10*3/uL — AB (ref 0.2–0.9)
Monocytes Relative: 11 %
NEUTROS ABS: 5.4 10*3/uL (ref 1.4–6.5)
Neutrophils Relative %: 58 %
Platelets: 306 10*3/uL (ref 150–440)
RBC: 4.95 MIL/uL (ref 3.80–5.20)
RDW: 17.1 % — AB (ref 11.5–14.5)
WBC: 9.1 10*3/uL (ref 3.6–11.0)

## 2016-04-25 LAB — IRON AND TIBC
IRON: 30 ug/dL (ref 28–170)
Saturation Ratios: 7 % — ABNORMAL LOW (ref 10.4–31.8)
TIBC: 423 ug/dL (ref 250–450)
UIBC: 393 ug/dL

## 2016-04-25 LAB — FERRITIN: Ferritin: 16 ng/mL (ref 11–307)

## 2016-04-26 NOTE — Progress Notes (Deleted)
[place]  Telephone:([phone] Fax:(336) [phone]  ID: Donna Hart OB: [birth year]  MR#: [id number]  [id number]#:[id number]  Patient Care Team: Gayland Curry, MD as PCP - General (Family Medicine)  CHIEF COMPLAINT: Iron deficiency anemia.  INTERVAL HISTORY: Patient returns to clinic today for repeat laboratory work and further evaluation. She does not complain of weakness or fatigue today. She continues to have occasional dyspnea on exertion and shortness of breath. 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  She denies any chest pain.  She denies any easy bleeding or bruising.  She has no nausea, vomiting, constipation, or diarrhea. She has Crohn's disease therefore does have occasional hematochezia. She has a good appetite and has maintained her weight. Patient offers no further specific complaints today.  REVIEW OF SYSTEMS:   Review of Systems  Constitutional: Negative for fever, malaise/fatigue and weight loss.  Respiratory: Positive for shortness of breath.   Cardiovascular: Negative.  Negative for chest pain and leg swelling.  Gastrointestinal: Positive for blood in stool. Negative for abdominal pain and melena.  Genitourinary: Negative.   Musculoskeletal: Negative.   Neurological: Negative.  Negative for weakness.  Psychiatric/Behavioral: Negative.  The patient is not nervous/anxious.     As per HPI. Otherwise, a complete review of systems is negative.  PAST MEDICAL HISTORY: Past Medical History:  Diagnosis Date  . Crohn's disease ([place])   . IDA (iron deficiency anemia)   . Multinodular goiter     PAST SURGICAL HISTORY: Past Surgical History:  Procedure Laterality Date  . CESAREAN SECTION      FAMILY HISTORY: Family History  Problem Relation Age of Onset  . Breast cancer Maternal Aunt     mat great aunt       ADVANCED DIRECTIVES:    HEALTH MAINTENANCE: Social History  Substance Use Topics  . Smoking status: Never Smoker    . Smokeless tobacco: Never Used  . Alcohol use Not on file     Colonoscopy:  PAP:  Bone density:  Lipid panel:  Allergies  Allergen Reactions  . Levofloxacin Other (See Comments)    Achilles tendon pain  . Ciprofloxacin Palpitations    Heart Palpitations  . Morphine Rash    Current Outpatient Prescriptions  Medication Sig Dispense Refill  . acetaminophen (TYLENOL) 500 MG tablet Take 1,000 mg by mouth.    Marland Kitchen amLODipine (NORVASC) 5 MG tablet Take 1 tablet by mouth daily.  11  . BIOTIN PO Take 1 tablet by mouth daily.    . Calcium Carbonate-Vitamin D 600-400 MG-UNIT tablet Take 1 tablet by mouth 2 (two) times daily.    . Golimumab 50 MG/0.5ML SOAJ Inject 1 Dose into the skin every 30 (thirty) days.    . Multiple Vitamins-Minerals (MULTIVITAMIN ADULT PO) Take 1 tablet by mouth daily.    Marland Kitchen omeprazole (PRILOSEC) 20 MG capsule Take 20 mg by mouth daily.    . TURMERIC PO Take 1 tablet by mouth 2 (two) times daily.     No current facility-administered medications for this visit.     OBJECTIVE: There were no vitals filed for this visit.   There is no height or weight on file to calculate BMI.    ECOG FS:0 - Asymptomatic  General: Well-developed, well-nourished, no acute distress. Eyes: Pink conjunctiva, anicteric sclera. Lungs: Clear to auscultation bilaterally. Heart: Regular rate and rhythm. No rubs, murmurs, or gallops. Abdomen: Soft, nontender, nondistended. No organomegaly noted, normoactive bowel sounds. Musculoskeletal: No edema, cyanosis, or clubbing. Neuro: Alert, answering  all questions appropriately. Cranial nerves grossly intact. Skin: No rashes or petechiae noted. Psych: Normal affect.   LAB RESULTS:  No results found for: NA, K, CL, CO2, GLUCOSE, BUN, CREATININE, CALCIUM, PROT, ALBUMIN, AST, ALT, ALKPHOS, BILITOT, GFRNONAA, GFRAA  Lab Results  Component Value Date   WBC 9.1 04/25/2016   NEUTROABS 5.4 04/25/2016   HGB 13.9 04/25/2016   HCT 42.0 04/25/2016    MCV 84.8 04/25/2016   PLT 306 04/25/2016   Lab Results  Component Value Date   IRON 30 04/25/2016   TIBC 423 04/25/2016   IRONPCTSAT 7 (L) 04/25/2016    Lab Results  Component Value Date   FERRITIN 16 04/25/2016     STUDIES: No results found.  ASSESSMENT: Iron deficiency anemia.  PLAN:    1.  Iron deficiency anemia:  Likely secondary to chronic malabsorption and Crohn's disease. Patient's hemoglobin continues to be within normal limits, and her iron stores continue to be slightly decreased. Proceed with one infusion of 510 mg IV Feraheme today. Return to clinic in 3 months with repeat laboratory work and further evaluation. She may require maintenance Feraheme in the future.  2.  Crohn's Disease: Continue evaluation and treatment per GI. Next line 3. Hypertension: Patient's blood pressure significantly elevated today. Continue monitoring and treatment per primary care.  Patient expressed understanding and was in agreement with this plan. She also understands that She can call clinic at any time with any questions, concerns, or complaints.    Lloyd Huger, MD   04/26/2016 12:08 AM

## 2016-04-27 ENCOUNTER — Inpatient Hospital Stay: Payer: BLUE CROSS/BLUE SHIELD | Admitting: Oncology

## 2016-04-27 ENCOUNTER — Ambulatory Visit: Payer: BLUE CROSS/BLUE SHIELD | Admitting: Oncology

## 2016-04-27 ENCOUNTER — Ambulatory Visit: Payer: BLUE CROSS/BLUE SHIELD

## 2016-04-27 ENCOUNTER — Inpatient Hospital Stay: Payer: BLUE CROSS/BLUE SHIELD

## 2016-11-08 DIAGNOSIS — K50013 Crohn's disease of small intestine with fistula: Secondary | ICD-10-CM | POA: Insufficient documentation

## 2016-12-26 ENCOUNTER — Ambulatory Visit
Admission: RE | Admit: 2016-12-26 | Discharge: 2016-12-26 | Disposition: A | Discharge status: Home / Self Care | Payer: BLUE CROSS/BLUE SHIELD | Source: Ambulatory Visit | Attending: Family Medicine | Admitting: Family Medicine

## 2016-12-26 ENCOUNTER — Other Ambulatory Visit: Payer: Self-pay | Admitting: Family Medicine

## 2016-12-26 DIAGNOSIS — Z1231 Encounter for screening mammogram for malignant neoplasm of breast: Secondary | ICD-10-CM | POA: Insufficient documentation

## 2017-01-03 NOTE — Progress Notes (Signed)
[place]  Telephone:(336) [phone] Fax:(336) [phone]  ID: Donna Hart OB: 11/09/1955  MR#: [id number]  GGE#:[id number]  Patient Care Team: Gayland Curry, MD as PCP - General (Family Medicine)  CHIEF COMPLAINT: Iron deficiency anemia.  INTERVAL HISTORY: Patient returns to clinic today for repeat laboratory work and further evaluation. She has noted increasing weakness and fatigue over the past several weeks.  She also reports her husband had a knee replacement yesterday and her 8 year old father had a hip replacement earlier this week. She continues to have occasional dyspnea on exertion and shortness of breath. 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  She denies any chest pain.  She denies any easy bleeding or bruising.  She has no nausea, vomiting, constipation, or diarrhea. She has Crohn's disease therefore does have occasional hematochezia. She has a good appetite and has maintained her weight. Patient offers no further specific complaints today.  REVIEW OF SYSTEMS:   Review of Systems  Constitutional: Positive for malaise/fatigue. Negative for fever and weight loss.  Respiratory: Positive for shortness of breath.   Cardiovascular: Negative.  Negative for chest pain and leg swelling.  Gastrointestinal: Positive for blood in stool. Negative for abdominal pain and melena.  Genitourinary: Negative.  Negative for hematuria.  Musculoskeletal: Negative.   Skin: Negative.  Negative for rash.  Neurological: Positive for weakness.  Psychiatric/Behavioral: Negative.  The patient is not nervous/anxious.     As per HPI. Otherwise, a complete review of systems is negative.  PAST MEDICAL HISTORY: Past Medical History:  Diagnosis Date  . Crohn's disease ([place])   . IDA (iron deficiency anemia)   . Multinodular goiter     PAST SURGICAL HISTORY: Past Surgical History:  Procedure Laterality Date  . CESAREAN SECTION      FAMILY HISTORY: Family  History  Problem Relation Age of Onset  . Breast cancer Maternal Aunt 12  . Breast cancer Paternal Aunt        pat great aunts       ADVANCED DIRECTIVES:    HEALTH MAINTENANCE: Social History   Tobacco Use  . Smoking status: Never Smoker  . Smokeless tobacco: Never Used  Substance Use Topics  . Alcohol use: Not on file  . Drug use: Not on file     Colonoscopy:  PAP:  Bone density:  Lipid panel:  Allergies  Allergen Reactions  . Levofloxacin Other (See Comments)    Achilles tendon pain  . Ciprofloxacin Palpitations    Heart Palpitations  . Morphine Rash    Current Outpatient Medications  Medication Sig Dispense Refill  . acetaminophen (TYLENOL) 500 MG tablet Take 1,000 mg by mouth.    Marland Kitchen amLODipine (NORVASC) 5 MG tablet Take 1 tablet by mouth daily.  11  . BIOTIN PO Take 1 tablet by mouth daily.    . Calcium Carbonate-Vitamin D 600-400 MG-UNIT tablet Take 1 tablet by mouth 2 (two) times daily.    . Multiple Vitamins-Minerals (MULTIVITAMIN ADULT PO) Take 1 tablet by mouth daily.    Marland Kitchen omeprazole (PRILOSEC) 20 MG capsule Take 20 mg by mouth daily.    . TURMERIC PO Take 1 tablet by mouth 2 (two) times daily.    Marland Kitchen Ustekinumab (STELARA [place]) Inject into the skin.     No current facility-administered medications for this visit.     OBJECTIVE: Vitals:   01/04/17 1405  BP: (!) 150/80  Pulse: 80  Resp: 18  Temp: 98.6 F (37 C)     Body  mass index is 49.71 kg/m.    ECOG FS:0 - Asymptomatic  General: Well-developed, well-nourished, no acute distress. Eyes: Pink conjunctiva, anicteric sclera. Lungs: Clear to auscultation bilaterally. Heart: Regular rate and rhythm. No rubs, murmurs, or gallops. Abdomen: Soft, nontender, nondistended. No organomegaly noted, normoactive bowel sounds. Musculoskeletal: No edema, cyanosis, or clubbing. Neuro: Alert, answering all questions appropriately. Cranial nerves grossly intact. Skin: No rashes or petechiae noted. Psych: Normal  affect.   LAB RESULTS:  No results found for: NA, K, CL, CO2, GLUCOSE, BUN, CREATININE, CALCIUM, PROT, ALBUMIN, AST, ALT, ALKPHOS, BILITOT, GFRNONAA, GFRAA  Lab Results  Component Value Date   WBC 9.1 04/25/2016   NEUTROABS 5.4 04/25/2016   HGB 13.9 04/25/2016   HCT 42.0 04/25/2016   MCV 84.8 04/25/2016   PLT 306 04/25/2016   Lab Results  Component Value Date   IRON 30 04/25/2016   TIBC 423 04/25/2016   IRONPCTSAT 7 (L) 04/25/2016    Lab Results  Component Value Date   FERRITIN 16 04/25/2016     STUDIES: Mm Digital Screening Bilateral  Result Date: 12/26/2016 CLINICAL DATA:  Screening. EXAM: DIGITAL SCREENING BILATERAL MAMMOGRAM WITH CAD COMPARISON:  Previous exam(s). ACR Breast Density Category b: There are scattered areas of fibroglandular density. FINDINGS: There are no findings suspicious for malignancy. Images were processed with CAD. IMPRESSION: No mammographic evidence of malignancy. A result letter of this screening mammogram Hart be mailed directly to the patient. RECOMMENDATION: Screening mammogram in one year. (Code:SM-B-01Y) BI-RADS CATEGORY  1: Negative. Electronically Signed   By: Claudie Revering M.D.   On: 12/26/2016 16:37    ASSESSMENT: Iron deficiency anemia.  PLAN:    1.  Iron deficiency anemia:  Likely secondary to chronic malabsorption and Crohn's disease. Patient's hemoglobin and iron stores have trended down, and she is symptomatic.  Proceed with 510 mg IV Feraheme today.  Patient Hart then return to clinic on January 10, 2016 for a second infusion.  Return to clinic in 3 months with repeat laboratory work and further evaluation. She likely Hart require maintenance Feraheme in the future.  2.  Crohn's Disease: Continue evaluation and treatment per GI.  3. Hypertension: Patient's blood pressure is mildly elevated today. Continue monitoring and treatment per primary care.  Approximately 30 minutes spent in discussion of which greater than 50% was  consultation.  Patient expressed understanding and was in agreement with this plan. She also understands that She can call clinic at any time with any questions, concerns, or complaints.    Lloyd Huger, MD   01/04/2017 2:38 PM

## 2017-01-04 ENCOUNTER — Inpatient Hospital Stay: Payer: BLUE CROSS/BLUE SHIELD | Attending: Oncology | Admitting: Oncology

## 2017-01-04 ENCOUNTER — Inpatient Hospital Stay: Payer: BLUE CROSS/BLUE SHIELD

## 2017-01-04 VITALS — BP 133/64 | HR 76 | Temp 97.9°F | Resp 18

## 2017-01-04 VITALS — BP 150/80 | HR 80 | Temp 98.6°F | Resp 18 | Wt 280.6 lb

## 2017-01-04 DIAGNOSIS — E042 Nontoxic multinodular goiter: Secondary | ICD-10-CM | POA: Insufficient documentation

## 2017-01-04 DIAGNOSIS — D509 Iron deficiency anemia, unspecified: Secondary | ICD-10-CM | POA: Diagnosis not present

## 2017-01-04 DIAGNOSIS — R531 Weakness: Secondary | ICD-10-CM

## 2017-01-04 DIAGNOSIS — K5090 Crohn's disease, unspecified, without complications: Secondary | ICD-10-CM | POA: Insufficient documentation

## 2017-01-04 DIAGNOSIS — R5383 Other fatigue: Secondary | ICD-10-CM | POA: Diagnosis not present

## 2017-01-04 DIAGNOSIS — Z79899 Other long term (current) drug therapy: Secondary | ICD-10-CM | POA: Diagnosis not present

## 2017-01-04 DIAGNOSIS — D508 Other iron deficiency anemias: Secondary | ICD-10-CM

## 2017-01-04 DIAGNOSIS — I10 Essential (primary) hypertension: Secondary | ICD-10-CM | POA: Diagnosis not present

## 2017-01-04 DIAGNOSIS — R0602 Shortness of breath: Secondary | ICD-10-CM

## 2017-01-04 DIAGNOSIS — Z803 Family history of malignant neoplasm of breast: Secondary | ICD-10-CM | POA: Diagnosis not present

## 2017-01-04 MED ORDER — SODIUM CHLORIDE 0.9 % IV SOLN
510.0000 mg | Freq: Once | INTRAVENOUS | Status: AC
  Administered 2017-01-04: 510 mg via INTRAVENOUS
  Filled 2017-01-04: qty 17

## 2017-01-04 MED ORDER — FERUMOXYTOL INJECTION 510 MG/17 ML
INTRAVENOUS | Status: AC
  Filled 2017-01-04: qty 17

## 2017-01-04 MED ORDER — SODIUM CHLORIDE 0.9 % IV SOLN
Freq: Once | INTRAVENOUS | Status: AC
  Administered 2017-01-04: 15:00:00 via INTRAVENOUS
  Filled 2017-01-04: qty 1000

## 2017-01-09 ENCOUNTER — Inpatient Hospital Stay: Payer: BLUE CROSS/BLUE SHIELD | Attending: Oncology

## 2017-01-09 VITALS — BP 164/77 | HR 74 | Resp 18

## 2017-01-09 DIAGNOSIS — D509 Iron deficiency anemia, unspecified: Secondary | ICD-10-CM | POA: Diagnosis not present

## 2017-01-09 DIAGNOSIS — Z79899 Other long term (current) drug therapy: Secondary | ICD-10-CM | POA: Diagnosis not present

## 2017-01-09 DIAGNOSIS — D508 Other iron deficiency anemias: Secondary | ICD-10-CM

## 2017-01-09 MED ORDER — SODIUM CHLORIDE 0.9 % IV SOLN
Freq: Once | INTRAVENOUS | Status: AC
  Administered 2017-01-09: 14:00:00 via INTRAVENOUS
  Filled 2017-01-09: qty 1000

## 2017-01-09 MED ORDER — FERUMOXYTOL INJECTION 510 MG/17 ML
INTRAVENOUS | Status: AC
  Filled 2017-01-09: qty 17

## 2017-01-09 MED ORDER — SODIUM CHLORIDE 0.9 % IV SOLN
510.0000 mg | Freq: Once | INTRAVENOUS | Status: AC
  Administered 2017-01-09: 510 mg via INTRAVENOUS
  Filled 2017-01-09: qty 17

## 2017-01-09 NOTE — Patient Instructions (Signed)

## 2017-03-31 NOTE — Progress Notes (Signed)
[place]  Telephone:(336) [phone] Fax:(336) [phone]  ID: Donna Hart OB: September 10, 1955  MR#: [id number]  LOV#:[id number]  Patient Care Team: Gayland Curry, MD as PCP - General (Family Medicine)  CHIEF COMPLAINT: Iron deficiency anemia.  INTERVAL HISTORY: Patient returns to clinic today for repeat laboratory work and further evaluation. She currently feels well and is asymptomatic. 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  She denies any chest pain or shortness of breath.  She denies any easy bleeding or bruising.  She has no nausea, vomiting, constipation, or diarrhea. She has Crohn's disease therefore does have occasional hematochezia. She has a good appetite and has maintained her weight. Patient offers no specific complaints today.  REVIEW OF SYSTEMS:   Review of Systems  Constitutional: Negative.  Negative for fever, malaise/fatigue and weight loss.  Respiratory: Negative.  Negative for cough and shortness of breath.   Cardiovascular: Negative.  Negative for chest pain and leg swelling.  Gastrointestinal: Positive for blood in stool. Negative for abdominal pain and melena.  Genitourinary: Negative.  Negative for hematuria.  Musculoskeletal: Negative.   Skin: Negative.  Negative for rash.  Neurological: Negative.  Negative for sensory change, focal weakness and weakness.  Psychiatric/Behavioral: Negative.  The patient is not nervous/anxious.     As per HPI. Otherwise, a complete review of systems is negative.  PAST MEDICAL HISTORY: Past Medical History:  Diagnosis Date  . Crohn's disease ([place])   . IDA (iron deficiency anemia)   . Multinodular goiter     PAST SURGICAL HISTORY: Past Surgical History:  Procedure Laterality Date  . CESAREAN SECTION      FAMILY HISTORY: Family History  Problem Relation Age of Onset  . Breast cancer Maternal Aunt 44  . Breast cancer Paternal Aunt        pat great aunts       ADVANCED  DIRECTIVES:    HEALTH MAINTENANCE: Social History   Tobacco Use  . Smoking status: Never Smoker  . Smokeless tobacco: Never Used  Substance Use Topics  . Alcohol use: Not on file  . Drug use: Not on file     Colonoscopy:  PAP:  Bone density:  Lipid panel:  Allergies  Allergen Reactions  . Levofloxacin Other (See Comments)    Achilles tendon pain  . Ciprofloxacin Palpitations    Heart Palpitations  . Morphine Rash    Current Outpatient Medications  Medication Sig Dispense Refill  . acetaminophen (TYLENOL) 500 MG tablet Take 1,000 mg by mouth.    Marland Kitchen amLODipine (NORVASC) 5 MG tablet Take 1 tablet by mouth daily.  11  . BIOTIN PO Take 1 tablet by mouth daily.    . Calcium Carbonate-Vitamin D 600-400 MG-UNIT tablet Take 1 tablet by mouth 2 (two) times daily.    . Multiple Vitamins-Minerals (MULTIVITAMIN ADULT PO) Take 1 tablet by mouth daily.    Marland Kitchen omeprazole (PRILOSEC) 20 MG capsule Take 20 mg by mouth daily.    . TURMERIC PO Take 1 tablet by mouth 2 (two) times daily.    Marland Kitchen Ustekinumab (STELARA [place]) Inject into the skin.     No current facility-administered medications for this visit.     OBJECTIVE: Vitals:   04/05/17 0928  BP: (!) 153/65  Pulse: 81  Resp: 20  Temp: 98.6 F (37 C)     Body mass index is 50.22 kg/m.    ECOG FS:0 - Asymptomatic  General: Well-developed, well-nourished, no acute distress. Eyes: Pink conjunctiva, anicteric sclera. Lungs:  Clear to auscultation bilaterally. Heart: Regular rate and rhythm. No rubs, murmurs, or gallops. Abdomen: Soft, nontender, nondistended. No organomegaly noted, normoactive bowel sounds. Musculoskeletal: No edema, cyanosis, or clubbing. Neuro: Alert, answering all questions appropriately. Cranial nerves grossly intact. Skin: No rashes or petechiae noted. Psych: Normal affect.   LAB RESULTS:  No results found for: NA, K, CL, CO2, GLUCOSE, BUN, CREATININE, CALCIUM, PROT, ALBUMIN, AST, ALT, ALKPHOS, BILITOT,  GFRNONAA, GFRAA  Lab Results  Component Value Date   WBC 9.1 04/04/2017   NEUTROABS 5.6 04/04/2017   HGB 13.5 04/04/2017   HCT 39.5 04/04/2017   MCV 83.9 04/04/2017   PLT 305 04/04/2017   Lab Results  Component Value Date   IRON 37 04/04/2017   TIBC 391 04/04/2017   IRONPCTSAT 10 (L) 04/04/2017    Lab Results  Component Value Date   FERRITIN 33 04/04/2017     STUDIES: No results found.  ASSESSMENT: Iron deficiency anemia.  PLAN:    1.  Iron deficiency anemia:  Likely secondary to chronic malabsorption and Crohn's disease. Patient's hemoglobin is within normal limits.  She has a slightly decreased iron saturation therefore will proceed with one infusion of 510 mg IV Feraheme.  She does not require second infusion. Return to clinic in 3 months with repeat laboratory work and further evaluation. She likely will require maintenance Feraheme in the future.  2. Crohn's Disease: Continue evaluation and treatment per GI.  3. Hypertension: Patient's blood pressure is mildly elevated today. Continue monitoring and treatment per primary care.  Approximately 30 minutes spent in discussion of which greater than 50% was consultation.  Patient expressed understanding and was in agreement with this plan. She also understands that She can call clinic at any time with any questions, concerns, or complaints.    Lloyd Huger, MD   04/09/2017 1:37 AM

## 2017-04-03 ENCOUNTER — Inpatient Hospital Stay: Payer: BLUE CROSS/BLUE SHIELD

## 2017-04-04 ENCOUNTER — Inpatient Hospital Stay: Payer: BLUE CROSS/BLUE SHIELD | Attending: Oncology

## 2017-04-04 DIAGNOSIS — I10 Essential (primary) hypertension: Secondary | ICD-10-CM | POA: Diagnosis not present

## 2017-04-04 DIAGNOSIS — K5090 Crohn's disease, unspecified, without complications: Secondary | ICD-10-CM | POA: Insufficient documentation

## 2017-04-04 DIAGNOSIS — Z79899 Other long term (current) drug therapy: Secondary | ICD-10-CM | POA: Diagnosis not present

## 2017-04-04 DIAGNOSIS — D509 Iron deficiency anemia, unspecified: Secondary | ICD-10-CM | POA: Insufficient documentation

## 2017-04-04 DIAGNOSIS — D508 Other iron deficiency anemias: Secondary | ICD-10-CM

## 2017-04-04 LAB — CBC WITH DIFFERENTIAL/PLATELET
BASOS ABS: 0 10*3/uL (ref 0–0.1)
Basophils Relative: 1 %
EOS PCT: 3 %
Eosinophils Absolute: 0.3 10*3/uL (ref 0–0.7)
HCT: 39.5 % (ref 35.0–47.0)
HEMOGLOBIN: 13.5 g/dL (ref 12.0–16.0)
LYMPHS ABS: 2.4 10*3/uL (ref 1.0–3.6)
LYMPHS PCT: 26 %
MCH: 28.6 pg (ref 26.0–34.0)
MCHC: 34.1 g/dL (ref 32.0–36.0)
MCV: 83.9 fL (ref 80.0–100.0)
MONO ABS: 0.7 10*3/uL (ref 0.2–0.9)
MONOS PCT: 8 %
NEUTROS ABS: 5.6 10*3/uL (ref 1.4–6.5)
Neutrophils Relative %: 62 %
Platelets: 305 10*3/uL (ref 150–440)
RBC: 4.71 MIL/uL (ref 3.80–5.20)
RDW: 17.2 % — ABNORMAL HIGH (ref 11.5–14.5)
WBC: 9.1 10*3/uL (ref 3.6–11.0)

## 2017-04-04 LAB — IRON AND TIBC
IRON: 37 ug/dL (ref 28–170)
Saturation Ratios: 10 % — ABNORMAL LOW (ref 10.4–31.8)
TIBC: 391 ug/dL (ref 250–450)
UIBC: 354 ug/dL

## 2017-04-04 LAB — FERRITIN: Ferritin: 33 ng/mL (ref 11–307)

## 2017-04-05 ENCOUNTER — Inpatient Hospital Stay: Payer: BLUE CROSS/BLUE SHIELD

## 2017-04-05 ENCOUNTER — Inpatient Hospital Stay: Payer: BLUE CROSS/BLUE SHIELD | Admitting: Oncology

## 2017-04-05 VITALS — BP 142/73 | HR 68 | Resp 20

## 2017-04-05 VITALS — BP 153/65 | HR 81 | Temp 98.6°F | Resp 20 | Wt 283.5 lb

## 2017-04-05 DIAGNOSIS — D508 Other iron deficiency anemias: Secondary | ICD-10-CM

## 2017-04-05 DIAGNOSIS — K5090 Crohn's disease, unspecified, without complications: Secondary | ICD-10-CM

## 2017-04-05 DIAGNOSIS — D509 Iron deficiency anemia, unspecified: Secondary | ICD-10-CM

## 2017-04-05 DIAGNOSIS — I10 Essential (primary) hypertension: Secondary | ICD-10-CM

## 2017-04-05 MED ORDER — SODIUM CHLORIDE 0.9 % IV SOLN
Freq: Once | INTRAVENOUS | Status: AC
  Administered 2017-04-05: 10:00:00 via INTRAVENOUS
  Filled 2017-04-05: qty 1000

## 2017-04-05 MED ORDER — SODIUM CHLORIDE 0.9 % IV SOLN
510.0000 mg | Freq: Once | INTRAVENOUS | Status: AC
  Administered 2017-04-05: 510 mg via INTRAVENOUS
  Filled 2017-04-05: qty 17

## 2017-04-05 NOTE — Patient Instructions (Signed)

## 2017-04-05 NOTE — Progress Notes (Signed)
Patient denies any concerns today.  

## 2017-05-08 DIAGNOSIS — Z6841 Body Mass Index (BMI) 40.0 and over, adult: Secondary | ICD-10-CM | POA: Insufficient documentation

## 2017-05-08 DIAGNOSIS — R011 Cardiac murmur, unspecified: Secondary | ICD-10-CM | POA: Insufficient documentation

## 2017-07-05 ENCOUNTER — Other Ambulatory Visit: Payer: BLUE CROSS/BLUE SHIELD

## 2017-07-12 ENCOUNTER — Other Ambulatory Visit: Payer: Self-pay | Admitting: *Deleted

## 2017-07-12 ENCOUNTER — Ambulatory Visit: Payer: BLUE CROSS/BLUE SHIELD | Admitting: Oncology

## 2017-07-12 ENCOUNTER — Ambulatory Visit: Payer: BLUE CROSS/BLUE SHIELD

## 2017-07-12 DIAGNOSIS — D649 Anemia, unspecified: Secondary | ICD-10-CM

## 2017-07-14 NOTE — Progress Notes (Deleted)
[place]  [phone] [phone]  ID: Seriah Brotzman OB: Jun 01, 1955  MR#: [id number]  VEH#:[id number]  Patient Care Team: Gayland Curry, MD as PCP - General (Family Medicine)  CHIEF COMPLAINT: Iron deficiency anemia.  INTERVAL HISTORY: Patient returns to clinic today for repeat laboratory work and further evaluation. She currently feels well and is asymptomatic. 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  She denies any chest pain or shortness of breath.  She denies any easy bleeding or bruising.  She has no nausea, vomiting, constipation, or diarrhea. She has Crohn's disease therefore does have occasional hematochezia. She has a good appetite and has maintained her weight. Patient offers no specific complaints today.  REVIEW OF SYSTEMS:   Review of Systems  Constitutional: Negative.  Negative for fever, malaise/fatigue and weight loss.  Respiratory: Negative.  Negative for cough and shortness of breath.   Cardiovascular: Negative.  Negative for chest pain and leg swelling.  Gastrointestinal: Positive for blood in stool. Negative for abdominal pain and melena.  Genitourinary: Negative.  Negative for hematuria.  Musculoskeletal: Negative.   Skin: Negative.  Negative for rash.  Neurological: Negative.  Negative for sensory change, focal weakness and weakness.  Psychiatric/Behavioral: Negative.  The patient is not nervous/anxious.     As per HPI. Otherwise, a complete review of systems is negative.  PAST MEDICAL HISTORY: Past Medical History:  Diagnosis Date  . Crohn's disease ([place])   . IDA (iron deficiency anemia)   . Multinodular goiter     PAST SURGICAL HISTORY: Past Surgical History:  Procedure Laterality Date  . CESAREAN SECTION      FAMILY HISTORY: Family History  Problem Relation Age of Onset  . Breast cancer Maternal Aunt 38  . Breast cancer Paternal Aunt        pat great aunts       ADVANCED  DIRECTIVES:    HEALTH MAINTENANCE: Social History   Tobacco Use  . Smoking status: Never Smoker  . Smokeless tobacco: Never Used  Substance Use Topics  . Alcohol use: Not on file  . Drug use: Not on file     Colonoscopy:  PAP:  Bone density:  Lipid panel:  Allergies  Allergen Reactions  . Levofloxacin Other (See Comments)    Achilles tendon pain  . Ciprofloxacin Palpitations    Heart Palpitations  . Morphine Rash    Current Outpatient Medications  Medication Sig Dispense Refill  . acetaminophen (TYLENOL) 500 MG tablet Take 1,000 mg by mouth.    Marland Kitchen amLODipine (NORVASC) 5 MG tablet Take 1 tablet by mouth daily.  11  . BIOTIN PO Take 1 tablet by mouth daily.    . Calcium Carbonate-Vitamin D 600-400 MG-UNIT tablet Take 1 tablet by mouth 2 (two) times daily.    . Multiple Vitamins-Minerals (MULTIVITAMIN ADULT PO) Take 1 tablet by mouth daily.    Marland Kitchen omeprazole (PRILOSEC) 20 MG capsule Take 20 mg by mouth daily.    . TURMERIC PO Take 1 tablet by mouth 2 (two) times daily.    Marland Kitchen Ustekinumab (STELARA [place] [facility]) Inject into the skin.     No current facility-administered medications for this visit.     OBJECTIVE: There were no vitals filed for this visit.   There is no height or weight on file to calculate BMI.    ECOG FS:0 - Asymptomatic  General: Well-developed, well-nourished, no acute distress. Eyes: Pink conjunctiva, anicteric sclera. Lungs: Clear to auscultation bilaterally. Heart: Regular rate and rhythm. No  rubs, murmurs, or gallops. Abdomen: Soft, nontender, nondistended. No organomegaly noted, normoactive bowel sounds. Musculoskeletal: No edema, cyanosis, or clubbing. Neuro: Alert, answering all questions appropriately. Cranial nerves grossly intact. Skin: No rashes or petechiae noted. Psych: Normal affect.   LAB RESULTS:  No results found for: NA, K, CL, CO2, GLUCOSE, BUN, CREATININE, CALCIUM, PROT, ALBUMIN, AST, ALT, ALKPHOS, BILITOT, GFRNONAA, GFRAA  Lab Results    Component Value Date   WBC 9.1 04/04/2017   NEUTROABS 5.6 04/04/2017   HGB 13.5 04/04/2017   HCT 39.5 04/04/2017   MCV 83.9 04/04/2017   PLT 305 04/04/2017   Lab Results  Component Value Date   IRON 37 04/04/2017   TIBC 391 04/04/2017   IRONPCTSAT 10 (L) 04/04/2017    Lab Results  Component Value Date   FERRITIN 33 04/04/2017     STUDIES: No results found.  ASSESSMENT: Iron deficiency anemia.  PLAN:    1.  Iron deficiency anemia:  Likely secondary to chronic malabsorption and Crohn's disease. Patient's hemoglobin is within normal limits.  She has a slightly decreased iron saturation therefore will proceed with one infusion of 510 mg IV Feraheme.  She does not require second infusion. Return to clinic in 3 months with repeat laboratory work and further evaluation. She likely will require maintenance Feraheme in the future.  2. Crohn's Disease: Continue evaluation and treatment per GI.  3. Hypertension: Patient's blood pressure is mildly elevated today. Continue monitoring and treatment per primary care.  Approximately 30 minutes spent in discussion of which greater than 50% was consultation.  Patient expressed understanding and was in agreement with this plan. She also understands that She can call clinic at any time with any questions, concerns, or complaints.    Lloyd Huger, MD   07/14/2017 8:35 AM

## 2017-07-18 ENCOUNTER — Inpatient Hospital Stay: Payer: BLUE CROSS/BLUE SHIELD

## 2017-07-19 ENCOUNTER — Inpatient Hospital Stay: Payer: BLUE CROSS/BLUE SHIELD | Admitting: Oncology

## 2017-07-19 ENCOUNTER — Inpatient Hospital Stay: Payer: BLUE CROSS/BLUE SHIELD

## 2017-10-14 NOTE — Progress Notes (Signed)
[facility]  [phone]) [phone] [phone]  ID: Donna Hart OB: 07-May-1955  MR#: [id number]  EQA#:[id number]  Patient Care Team: Gayland Curry, MD as PCP - General (Family Medicine)  CHIEF COMPLAINT: Iron deficiency anemia.  INTERVAL HISTORY: Patient returns to clinic today for repeat laboratory work, further evaluation, and consideration of IV Feraheme.  She currently feels well and is asymptomatic.  She continues to be active and work full-time.  She is planning a trip to the [place] in the next several weeks.  She has no neurologic complaints.  She denies any chest pain or shortness of breath.  She denies any easy bleeding or bruising.  She has no nausea, vomiting, constipation, or diarrhea. She has Crohn's disease therefore does have occasional hematochezia.  She has no urinary complaints.  Patient feels at her baseline offers no specific complaints today.    REVIEW OF SYSTEMS:   Review of Systems  Constitutional: Negative.  Negative for fever, malaise/fatigue and weight loss.  Respiratory: Negative.  Negative for cough and shortness of breath.   Cardiovascular: Negative.  Negative for chest pain and leg swelling.  Gastrointestinal: Positive for blood in stool. Negative for abdominal pain and melena.  Genitourinary: Negative.  Negative for hematuria.  Musculoskeletal: Negative.  Negative for myalgias.  Skin: Negative.  Negative for rash.  Neurological: Negative.  Negative for sensory change, focal weakness, weakness and headaches.  Psychiatric/Behavioral: Negative.  The patient is not nervous/anxious.     As per HPI. Otherwise, a complete review of systems is negative.  PAST MEDICAL HISTORY: Past Medical History:  Diagnosis Date  . Crohn's disease ([place])   . IDA (iron deficiency anemia)   . Multinodular goiter     PAST SURGICAL HISTORY: Past Surgical History:  Procedure Laterality Date  . CESAREAN SECTION      FAMILY  HISTORY: Family History  Problem Relation Age of Onset  . Breast cancer Maternal Aunt 37  . Breast cancer Paternal Aunt        pat great aunts       ADVANCED DIRECTIVES:    HEALTH MAINTENANCE: Social History   Tobacco Use  . Smoking status: Never Smoker  . Smokeless tobacco: Never Used  Substance Use Topics  . Alcohol use: Not on file  . Drug use: Not on file     Colonoscopy:  PAP:  Bone density:  Lipid panel:  Allergies  Allergen Reactions  . Levofloxacin Other (See Comments)    Achilles tendon pain  . Ciprofloxacin Palpitations    Heart Palpitations  . Morphine Rash    Current Outpatient Medications  Medication Sig Dispense Refill  . acetaminophen (TYLENOL) 500 MG tablet Take 1,000 mg by mouth.    Marland Kitchen amLODipine (NORVASC) 5 MG tablet Take 1 tablet by mouth daily.  11  . amoxicillin-clavulanate (AUGMENTIN) 875-125 MG tablet Take 1 tablet by mouth 2 (two) times daily.    Marland Kitchen BIOTIN PO Take 1 tablet by mouth daily.    . Calcium Carbonate-Vitamin D 600-400 MG-UNIT tablet Take 1 tablet by mouth 2 (two) times daily.    . Multiple Vitamins-Minerals (MULTIVITAMIN ADULT PO) Take 1 tablet by mouth daily.    Marland Kitchen omeprazole (PRILOSEC) 20 MG capsule Take 20 mg by mouth daily.    . TURMERIC PO Take 1 tablet by mouth 2 (two) times daily.    Marland Kitchen Ustekinumab (STELARA [place]) Inject into the skin.     No current facility-administered medications for this visit.     OBJECTIVE:  Vitals:   10/18/17 0922  BP: (!) 150/74  Pulse: 76  Resp: 20  Temp: (!) 97.3 F (36.3 C)     Body mass index is 49.87 kg/m.    ECOG FS:0 - Asymptomatic  General: Well-developed, well-nourished, no acute distress. Eyes: Pink conjunctiva, anicteric sclera. HEENT: Normocephalic, moist mucous membranes. Lungs: Clear to auscultation bilaterally. Heart: Regular rate and rhythm. No rubs, murmurs, or gallops. Abdomen: Soft, nontender, nondistended. No organomegaly noted, normoactive bowel  sounds. Musculoskeletal: No edema, cyanosis, or clubbing. Neuro: Alert, answering all questions appropriately. Cranial nerves grossly intact. Skin: No rashes or petechiae noted. Psych: Normal affect.   LAB RESULTS:  No results found for: NA, K, CL, CO2, GLUCOSE, BUN, CREATININE, CALCIUM, PROT, ALBUMIN, AST, ALT, ALKPHOS, BILITOT, GFRNONAA, GFRAA  Lab Results  Component Value Date   WBC 9.1 04/04/2017   NEUTROABS 5.6 04/04/2017   HGB 13.5 04/04/2017   HCT 39.5 04/04/2017   MCV 83.9 04/04/2017   PLT 305 04/04/2017   Lab Results  Component Value Date   IRON 37 04/04/2017   TIBC 391 04/04/2017   IRONPCTSAT 10 (L) 04/04/2017    Lab Results  Component Value Date   FERRITIN 33 04/04/2017     STUDIES: No results found.  ASSESSMENT: Iron deficiency anemia.  PLAN:    1.  Iron deficiency anemia:  Likely secondary to chronic malabsorption and Crohn's disease.  Patient's hemoglobin continues to be within normal limits, but her iron saturation remains decreased.  Proceed with one infusion of 510 mg IV Feraheme today.  She does not require second infusion.  Return to clinic in 3 months with repeat laboratory work and further evaluation.   2. Crohn's Disease: Chronic and unchanged.  Continue evaluation and treatment per GI.  3. Hypertension: Blood pressure is mildly elevated today.  Continue monitoring and treatment per primary care.  Patient expressed understanding and was in agreement with this plan. She also understands that She can call clinic at any time with any questions, concerns, or complaints.    Lloyd Huger, MD   10/19/2017 8:26 AM

## 2017-10-17 ENCOUNTER — Other Ambulatory Visit: Payer: BLUE CROSS/BLUE SHIELD

## 2017-10-18 ENCOUNTER — Inpatient Hospital Stay: Payer: BLUE CROSS/BLUE SHIELD | Attending: Oncology | Admitting: Oncology

## 2017-10-18 ENCOUNTER — Encounter: Payer: Self-pay | Admitting: Oncology

## 2017-10-18 ENCOUNTER — Inpatient Hospital Stay: Payer: BLUE CROSS/BLUE SHIELD

## 2017-10-18 VITALS — BP 150/74 | HR 76 | Temp 97.3°F | Resp 20 | Wt 281.5 lb

## 2017-10-18 VITALS — BP 141/84 | HR 67 | Resp 20

## 2017-10-18 DIAGNOSIS — K5090 Crohn's disease, unspecified, without complications: Secondary | ICD-10-CM | POA: Diagnosis not present

## 2017-10-18 DIAGNOSIS — Z79899 Other long term (current) drug therapy: Secondary | ICD-10-CM | POA: Insufficient documentation

## 2017-10-18 DIAGNOSIS — I10 Essential (primary) hypertension: Secondary | ICD-10-CM | POA: Diagnosis not present

## 2017-10-18 DIAGNOSIS — D509 Iron deficiency anemia, unspecified: Secondary | ICD-10-CM | POA: Diagnosis present

## 2017-10-18 DIAGNOSIS — D508 Other iron deficiency anemias: Secondary | ICD-10-CM

## 2017-10-18 MED ORDER — SODIUM CHLORIDE 0.9 % IV SOLN
510.0000 mg | Freq: Once | INTRAVENOUS | Status: AC
  Administered 2017-10-18: 510 mg via INTRAVENOUS
  Filled 2017-10-18: qty 17

## 2017-10-18 MED ORDER — SODIUM CHLORIDE 0.9 % IV SOLN
Freq: Once | INTRAVENOUS | Status: AC
  Administered 2017-10-18: 10:00:00 via INTRAVENOUS
  Filled 2017-10-18: qty 250

## 2017-10-18 NOTE — Patient Instructions (Signed)

## 2017-10-18 NOTE — Progress Notes (Signed)
Patient denies any concerns today.  

## 2018-01-10 NOTE — Progress Notes (Deleted)
[place]  [phone]) [phone] [phone]  ID: Donna Hart OB: 1955-09-24  MR#: [id number]  IZT#:[id number]  Patient Care Team: Gayland Curry, MD as PCP - General (Family Medicine)  CHIEF COMPLAINT: Iron deficiency anemia.  INTERVAL HISTORY: Patient returns to clinic today for repeat laboratory work, further evaluation, and consideration of IV Feraheme.  She currently feels well and is asymptomatic.  She continues to be active and work full-time.  She is planning a trip to the [place] in the next several weeks.  She has no neurologic complaints.  She denies any chest pain or shortness of breath.  She denies any easy bleeding or bruising.  She has no nausea, vomiting, constipation, or diarrhea. She has Crohn's disease therefore does have occasional hematochezia.  She has no urinary complaints.  Patient feels at her baseline offers no specific complaints today.    REVIEW OF SYSTEMS:   Review of Systems  Constitutional: Negative.  Negative for fever, malaise/fatigue and weight loss.  Respiratory: Negative.  Negative for cough and shortness of breath.   Cardiovascular: Negative.  Negative for chest pain and leg swelling.  Gastrointestinal: Positive for blood in stool. Negative for abdominal pain and melena.  Genitourinary: Negative.  Negative for hematuria.  Musculoskeletal: Negative.  Negative for myalgias.  Skin: Negative.  Negative for rash.  Neurological: Negative.  Negative for sensory change, focal weakness, weakness and headaches.  Psychiatric/Behavioral: Negative.  The patient is not nervous/anxious.     As per HPI. Otherwise, a complete review of systems is negative.  PAST MEDICAL HISTORY: Past Medical History:  Diagnosis Date  . Crohn's disease ([place])   . IDA (iron deficiency anemia)   . Multinodular goiter     PAST SURGICAL HISTORY: Past Surgical History:  Procedure Laterality Date  . CESAREAN SECTION      FAMILY  HISTORY: Family History  Problem Relation Age of Onset  . Breast cancer Maternal Aunt 32  . Breast cancer Paternal Aunt        pat great aunts       ADVANCED DIRECTIVES:    HEALTH MAINTENANCE: Social History   Tobacco Use  . Smoking status: Never Smoker  . Smokeless tobacco: Never Used  Substance Use Topics  . Alcohol use: Not on file  . Drug use: Not on file     Colonoscopy:  PAP:  Bone density:  Lipid panel:  Allergies  Allergen Reactions  . Levofloxacin Other (See Comments)    Achilles tendon pain  . Ciprofloxacin Palpitations    Heart Palpitations  . Morphine Rash    Current Outpatient Medications  Medication Sig Dispense Refill  . acetaminophen (TYLENOL) 500 MG tablet Take 1,000 mg by mouth.    Marland Kitchen amLODipine (NORVASC) 5 MG tablet Take 1 tablet by mouth daily.  11  . amoxicillin-clavulanate (AUGMENTIN) 875-125 MG tablet Take 1 tablet by mouth 2 (two) times daily.    Marland Kitchen BIOTIN PO Take 1 tablet by mouth daily.    . Calcium Carbonate-Vitamin D 600-400 MG-UNIT tablet Take 1 tablet by mouth 2 (two) times daily.    . Multiple Vitamins-Minerals (MULTIVITAMIN ADULT PO) Take 1 tablet by mouth daily.    Marland Kitchen omeprazole (PRILOSEC) 20 MG capsule Take 20 mg by mouth daily.    . TURMERIC PO Take 1 tablet by mouth 2 (two) times daily.    Marland Kitchen Ustekinumab (STELARA ) Inject into the skin.     No current facility-administered medications for this visit.     OBJECTIVE:  There were no vitals filed for this visit.   There is no height or weight on file to calculate BMI.    ECOG FS:0 - Asymptomatic  General: Well-developed, well-nourished, no acute distress. Eyes: Pink conjunctiva, anicteric sclera. HEENT: Normocephalic, moist mucous membranes. Lungs: Clear to auscultation bilaterally. Heart: Regular rate and rhythm. No rubs, murmurs, or gallops. Abdomen: Soft, nontender, nondistended. No organomegaly noted, normoactive bowel sounds. Musculoskeletal: No edema, cyanosis, or  clubbing. Neuro: Alert, answering all questions appropriately. Cranial nerves grossly intact. Skin: No rashes or petechiae noted. Psych: Normal affect.   LAB RESULTS:  No results found for: NA, K, CL, CO2, GLUCOSE, BUN, CREATININE, CALCIUM, PROT, ALBUMIN, AST, ALT, ALKPHOS, BILITOT, GFRNONAA, GFRAA  Lab Results  Component Value Date   WBC 9.1 04/04/2017   NEUTROABS 5.6 04/04/2017   HGB 13.5 04/04/2017   HCT 39.5 04/04/2017   MCV 83.9 04/04/2017   PLT 305 04/04/2017   Lab Results  Component Value Date   IRON 37 04/04/2017   TIBC 391 04/04/2017   IRONPCTSAT 10 (L) 04/04/2017    Lab Results  Component Value Date   FERRITIN 33 04/04/2017     STUDIES: No results found.  ASSESSMENT: Iron deficiency anemia.  PLAN:    1.  Iron deficiency anemia:  Likely secondary to chronic malabsorption and Crohn's disease.  Patient's hemoglobin continues to be within normal limits, but her iron saturation remains decreased.  Proceed with one infusion of 510 mg IV Feraheme today.  She does not require second infusion.  Return to clinic in 3 months with repeat laboratory work and further evaluation.   2. Crohn's Disease: Chronic and unchanged.  Continue evaluation and treatment per GI.  3. Hypertension: Blood pressure is mildly elevated today.  Continue monitoring and treatment per primary care.  Patient expressed understanding and was in agreement with this plan. She also understands that She can call clinic at any time with any questions, concerns, or complaints.    Lloyd Huger, MD   01/10/2018 2:06 PM

## 2018-01-11 ENCOUNTER — Ambulatory Visit
Admission: EM | Admit: 2018-01-11 | Discharge: 2018-01-11 | Disposition: A | Discharge status: Home / Self Care | Payer: BLUE CROSS/BLUE SHIELD

## 2018-01-11 ENCOUNTER — Encounter: Payer: Self-pay | Admitting: Emergency Medicine

## 2018-01-11 ENCOUNTER — Other Ambulatory Visit: Payer: Self-pay

## 2018-01-11 ENCOUNTER — Ambulatory Visit (INDEPENDENT_AMBULATORY_CARE_PROVIDER_SITE_OTHER): Payer: BLUE CROSS/BLUE SHIELD

## 2018-01-11 DIAGNOSIS — S67192A Crushing injury of right middle finger, initial encounter: Secondary | ICD-10-CM

## 2018-01-11 DIAGNOSIS — S60416A Abrasion of right little finger, initial encounter: Secondary | ICD-10-CM

## 2018-01-11 DIAGNOSIS — W230XXA Caught, crushed, jammed, or pinched between moving objects, initial encounter: Secondary | ICD-10-CM

## 2018-01-11 DIAGNOSIS — S61212A Laceration without foreign body of right middle finger without damage to nail, initial encounter: Secondary | ICD-10-CM | POA: Diagnosis not present

## 2018-01-11 DIAGNOSIS — X58XXXA Exposure to other specified factors, initial encounter: Secondary | ICD-10-CM

## 2018-01-11 DIAGNOSIS — S60410A Abrasion of right index finger, initial encounter: Secondary | ICD-10-CM

## 2018-01-11 DIAGNOSIS — S6991XA Unspecified injury of right wrist, hand and finger(s), initial encounter: Secondary | ICD-10-CM

## 2018-01-11 MED ORDER — MUPIROCIN 2 % EX OINT: 1.0000 "application " | TOPICAL_OINTMENT | Freq: Three times a day (TID) | CUTANEOUS | 0 refills | Status: DC

## 2018-01-11 NOTE — Discharge Instructions (Signed)
Keep dry for 24 hours.  Apply mupirocin ointment 3 times daily until sutures are removed in 10 days.  If any signs or symptoms of infection occur return to our clinic or go to your primary care physician immediately

## 2018-01-11 NOTE — ED Provider Notes (Signed)
[facility]    CSN: [id number] Arrival date & time: 01/11/18  1428     History   Chief Complaint Chief Complaint  Patient presents with  . Extremity Laceration    right 3rd    HPI Donna Hart is a 63 y.o. female.   HPI  - female accompanied by her husband presents today with right middle finger laceration from a crush injury.  She states that she was opening a window pane when the window let go and crushed her finger between the pane and the casing.  Has a 1.2 cm and the first laceration on the volar surface of the third finger.  Is just proximal to the flexion crease of the DIP she has 2 smaller abrasions over the index and ring fingers.       Past Medical History:  Diagnosis Date  . Crohn's disease ([place])   . IDA (iron deficiency anemia)   . Multinodular goiter     Patient Active Problem List   Diagnosis Date Noted  . Anemia 01/30/2016  . Iron deficiency anemia 07/25/2015  . Essential hypertension 05/20/2015  . Chronic ulcerative colitis with complication ([place]) 09/98/3382  . Pouchitis ([place]) 10/20/2013  . Disorder of thyroid 01/21/2013  . Abscess and cellulitis 12/24/2012  . Injury of vagina 08/13/2012  . Multinodular goiter 12/24/2011    Past Surgical History:  Procedure Laterality Date  . CESAREAN SECTION      OB History   No obstetric history on file.      Home Medications    Prior to Admission medications   Medication Sig Start Date End Date Taking? Authorizing Provider  amLODipine (NORVASC) 5 MG tablet Take 1 tablet by mouth daily. 07/15/15  Yes [provider]  BIOTIN PO Take 1 tablet by mouth daily.   Yes [provider]  Calcium Carbonate-Vitamin D 600-400 MG-UNIT tablet Take 1 tablet by mouth 2 (two) times daily.   Yes [provider]  lisinopril (PRINIVIL,ZESTRIL) 10 MG tablet Take by mouth. 10/24/17 10/24/18 Yes [provider]  Multiple Vitamins-Minerals (MULTIVITAMIN ADULT PO) Take 1 tablet  by mouth daily. 09/24/07  Yes [provider]  omeprazole (PRILOSEC) 20 MG capsule Take 20 mg by mouth daily.   Yes [provider]  TURMERIC PO Take 1 tablet by mouth 2 (two) times daily.   Yes [provider]  Ustekinumab (STELARA [place]) Inject into the skin.   Yes [provider]  acetaminophen (TYLENOL) 500 MG tablet Take 1,000 mg by mouth.    [provider]  mupirocin ointment (BACTROBAN) 2 % Apply 1 application topically 3 (three) times daily. 01/11/18   Lorin Picket, PA-C    Family History Family History  Problem Relation Age of Onset  . Breast cancer Maternal Aunt 64  . Breast cancer Paternal Aunt        pat great aunts    Social History Social History   Tobacco Use  . Smoking status: Never Smoker  . Smokeless tobacco: Never Used  Substance Use Topics  . Alcohol use: Not Currently    Alcohol/week: 0.0 standard drinks  . Drug use: Never     Allergies   Levofloxacin; Ciprofloxacin; and Morphine   Review of Systems Review of Systems  Constitutional: Positive for activity change. Negative for chills, fatigue and fever.  Skin: Positive for wound.  All other systems reviewed and are negative.    Physical Exam Triage Vital Signs ED Triage Vitals  Enc Vitals Group  BP 01/11/18 1519 (!) 152/65     Pulse Rate 01/11/18 1519 77     Resp 01/11/18 1519 16     Temp 01/11/18 1519 98.3 F (36.8 C)     Temp Source 01/11/18 1519 Oral     SpO2 01/11/18 1519 97 %     Weight 01/11/18 1516 255 lb (115.7 kg)     Height 01/11/18 1516 5' 3"  (1.6 m)     Head Circumference --      Peak Flow --      Pain Score 01/11/18 1516 7     Pain Loc --      Pain Edu? --      Excl. in  [place]? --    No data found.  Updated Vital Signs BP (!) 152/65 (BP Location: Left Arm)   Pulse 77   Temp 98.3 F (36.8 C) (Oral)   Resp 16   Ht 5' 3"  (1.6 m)   Wt 255 lb (115.7 kg)   SpO2 97%   BMI 45.17 kg/m   Visual Acuity Right Eye Distance:     Left Eye Distance:   Bilateral Distance:    Right Eye Near:   Left Eye Near:    Bilateral Near:     Physical Exam Vitals signs and nursing note reviewed.  Constitutional:      General: She is not in acute distress.    Appearance: Normal appearance. She is not ill-appearing, toxic-appearing or diaphoretic.  HENT:     Head: Normocephalic.  Eyes:     General:        Right eye: No discharge.        Left eye: No discharge.     Conjunctiva/sclera: Conjunctivae normal.  Neck:     Musculoskeletal: Normal range of motion and neck supple.  Musculoskeletal: Normal range of motion.        General: Tenderness and signs of injury present.     Comments: Exam of the right dominant third finger shows a 1.2 cm transverse laceration just proximal to the distal interphalangeal joint.  Patient has normal sensation to light touch.  FDS and FDP are intact and strong to clinical testing against resistance.  Small lacerations/abrasions are on the dorsum of the index finger and on the volar aspect of the ring fingers.  These do not require suturing.  Skin:    General: Skin is warm and dry.     Capillary Refill: Capillary refill takes less than 2 seconds.  Neurological:     General: No focal deficit present.     Mental Status: She is alert and oriented to person, place, and time.  Psychiatric:        Mood and Affect: Mood normal.        Behavior: Behavior normal.        Thought Content: Thought content normal.        Judgment: Judgment normal.      UC Treatments / Results  Labs (all labs ordered are listed, but only abnormal results are displayed) Labs Reviewed - No data to display  EKG None  Radiology Dg Hand Complete Right  Result Date: 01/11/2018 CLINICAL DATA:  Right hand injury localizing to third and fourth fingers. EXAM: RIGHT HAND - COMPLETE 3+ VIEW COMPARISON:  None. FINDINGS: There is no evidence of fracture or dislocation. There is no evidence of arthropathy or other focal bone  abnormality. Soft tissues are unremarkable. IMPRESSION: Negative. Electronically Signed   By: Jenness Corner.D.  On: 01/11/2018 16:17    Procedures Laceration Repair Date/Time: 01/11/2018 6:36 PM Performed by: Lorin Picket, PA-C Authorized by: Lorin Picket, PA-C   Consent:    Consent obtained:  Verbal   Consent given by:  Patient   Risks discussed:  Infection and pain Anesthesia (see MAR for exact dosages):    Anesthesia method:  Local infiltration   Local anesthetic:  Lidocaine 1% w/o epi Laceration details:    Location:  Finger   Finger location:  R long finger   Length (cm):  1.2   Depth (mm):  3 Repair type:    Repair type:  Simple Pre-procedure details:    Preparation:  Patient was prepped and draped in usual sterile fashion Exploration:    Hemostasis achieved with:  Direct pressure   Wound exploration: entire depth of wound probed and visualized     Contaminated: no   Treatment:    Area cleansed with:  Betadine and saline   Amount of cleaning:  Standard   Irrigation solution:  Sterile saline   Irrigation volume:  30   Irrigation method:  Pressure wash   Visualized foreign bodies/material removed: no   Skin repair:    Repair method:  Sutures   Suture size:  5-0   Suture material:  Nylon   Suture technique:  Simple interrupted   Number of sutures:  5 Approximation:    Approximation:  Close Post-procedure details:    Dressing:  Non-adherent dressing, antibiotic ointment and sterile dressing   Patient tolerance of procedure:  Tolerated well, no immediate complications Comments:     Keep dry for 24 hours.  Apply mupirocin ointment 3 times daily until sutures are removed in 10 days.  Any signs or symptoms of infection occur return to our clinic or your primary care physician as soon as possible   (including critical care time)  Medications Ordered in UC Medications - No data to display  Initial Impression / Assessment and Plan / UC Course  I have  reviewed the triage vital signs and the nursing notes.  Pertinent labs & imaging results that were available during my care of the patient were reviewed by me and considered in my medical decision making (see chart for details).    Keep dry for 24 hours.  Apply mupirocin ointment 3 times daily until sutures are removed in 10 days.  Any signs or symptoms of infection occur return to our clinic or your primary care physician as soon as possible    Final Clinical Impressions(s) / UC Diagnoses   Final diagnoses:  Laceration of right middle finger without foreign body without damage to nail, initial encounter     Discharge Instructions     Keep dry for 24 hours.  Apply mupirocin ointment 3 times daily until sutures are removed in 10 days.  If any signs or symptoms of infection occur return to our clinic or go to your primary care physician immediately    ED Prescriptions    Medication Sig Dispense Auth. Provider   mupirocin ointment (BACTROBAN) 2 % Apply 1 application topically 3 (three) times daily. 22 g Lorin Picket, PA-C     Controlled Substance Prescriptions [place] Controlled Substance Registry consulted? Not Applicable   Lorin Picket, PA-C 01/11/18 1839

## 2018-01-11 NOTE — ED Triage Notes (Signed)
Patient states that a window pane fell and cut her right 3rd finger today.  Patient has laceration to her right 3rd finger.  Minimal bleeding at this time.

## 2018-01-15 ENCOUNTER — Other Ambulatory Visit: Payer: BLUE CROSS/BLUE SHIELD

## 2018-01-17 ENCOUNTER — Ambulatory Visit: Payer: BLUE CROSS/BLUE SHIELD | Admitting: Oncology

## 2018-01-17 ENCOUNTER — Ambulatory Visit: Payer: BLUE CROSS/BLUE SHIELD

## 2018-11-03 NOTE — Progress Notes (Signed)
[facility]  [address] [place], [place] [address] Phone: [phone]  Fax: [phone]   Clinic Day:  11/05/2018  Referring physician: Gayland Curry, MD  Chief Complaint: Donna Hart is a 63 y.o. female with Crohn's disease and iron deficiency anemia who is seen for a new patient assessment.   HPI:  The patient has iron deficiency anemia likely secondary to chronic malabsorption and Crohn's disease. The patient has occasional hematochezia related to her Crohn's disease. She was previously seen by Dr. Grayland Ormond (07/25/2015 - 10/18/2017).   She was last seen in the hematology clinic on 10/18/2017 by Dr. Grayland Ormond.  At that time the patient was active. She denied any complaints. Labs on 10/16/2017 at [facility] revealed a hematocrit 40.8, hemoglobin 13.0, and MCV 88.1  Ferritin was 24 with an iron saturation of 10% and a TIBC 422 on 09/30/2017.  She received 510 mg IV Feraheme.   She received Feraheme on 07/26/2015, 01/30/2016, 01/04/2017, 01/09/2017, 03/28/2017 and 10/18/2017.  Ferritin has been followed: 9 on 01/19/2011, 20 on 04/25/2011, 15 on 07/25/2011, 9 on 10/24/2011, 11 on 01/30/2012,  10 on 04/04/2015, 22 on 07/02/2013, 11 on 07/19/2015, 19 on 01/18/2016, 16 on 04/25/2016, 8 on 12/26/2016, 33 on 04/04/2017, 45 on 07/03/2017, 24 on 09/30/2017, and 9 on 11/04/2018.   Labs followed: 04/04/2017: Hematocrit 39.5, hemoglobin 13.5, MCV 83.9, platelets 305,000, WBC 9,100. Iron saturation 10% with a TIBC of 391. 11/04/2018: Hematocrit 38.7, hemoglobin 12.2, MCV 83.0, platelets 232,000, WBC 8,900. Iron saturation 6% with a TIBC of 467.   Symptomatically, she feels "pretty good".  She has been fatigued for 3-4 weeks.  She notes complications of malabsorption after her J-pouch surgery in 2003. She has occasional hematochezia related to her Crohn's disease. She has muscle cramps, fatigue, and shortness of breath. She notes bleeding from a chronic abscess. The  patient describes loose stool as being "pasty". She denies melena. She has joint pain in her hips. She reports being easily dehydrated.  Her BP was 188/73 in the clinic today. Repeat BP is 146/81. She has an upcoming appointment with her PCP on 11/17/2018.    Past Medical History:  Diagnosis Date  . Crohn's disease ([place])   . IDA (iron deficiency anemia)   . Multinodular goiter     Past Surgical History:  Procedure Laterality Date  . CESAREAN SECTION      Family History  Problem Relation Age of Onset  . Breast cancer Maternal Aunt 45  . Breast cancer Paternal Aunt        pat great aunts  Her father was suspected to have an autoimmune disease. Her sister has ulcerative colitis.    Social History:  reports that she has never smoked. She has never used smokeless tobacco. She reports previous alcohol use. She reports that she does not use drugs.  She drinks about 2 glasses of wine a week. She denies any exposure to radiations or toxins. 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  She lives in [place].  The patient is alone today.  Allergies:  Allergies  Allergen Reactions  . Levofloxacin Other (See Comments)    Achilles tendon pain  . Ciprofloxacin Palpitations    Heart Palpitations  . Morphine Rash    Current Medications: Current Outpatient Medications  Medication Sig Dispense Refill  . acetaminophen (TYLENOL) 500 MG tablet Take 1,000 mg by mouth every 6 (six) hours as needed.     Marland Kitchen amLODipine (NORVASC) 5 MG  tablet Take 1 tablet by mouth daily.  11  . BIOTIN PO Take 1 tablet by mouth daily.    . Calcium Carbonate-Vitamin D 600-400 MG-UNIT tablet Take 1 tablet by mouth daily.     Marland Kitchen levothyroxine (SYNTHROID) 137 MCG tablet Take 137 mcg by mouth daily.    Marland Kitchen lisinopril (PRINIVIL,ZESTRIL) 10 MG tablet Take 10 mg by mouth daily.     . Multiple Vitamins-Minerals (MULTIVITAMIN ADULT PO) Take 1 tablet by mouth daily.    . mupirocin ointment (BACTROBAN) 2 %  Apply 1 application topically 3 (three) times daily. 22 g 0  . omeprazole (PRILOSEC) 20 MG capsule Take 20 mg by mouth daily.    . TURMERIC PO Take 1 tablet by mouth daily.     Marland Kitchen Ustekinumab (STELARA [place]) Inject 90 mg into the skin every 8 (eight) weeks.     . clonazePAM (KLONOPIN) 0.5 MG tablet Take 0.5 mg by mouth 2 (two) times daily as needed.      No current facility-administered medications for this visit.     Review of Systems  Constitutional: Positive for malaise/fatigue (x 3-4 weeks). Negative for chills, diaphoresis, fever and weight loss.       Feels "pretty good".  HENT: Negative.  Negative for congestion, ear pain, hearing loss, nosebleeds, sinus pain and sore throat.   Eyes: Negative.  Negative for blurred vision and double vision.  Respiratory: Positive for shortness of breath. Negative for cough, hemoptysis and sputum production.   Cardiovascular: Negative.  Negative for chest pain, palpitations, claudication, leg swelling and PND.  Gastrointestinal: Negative for abdominal pain, blood in stool, constipation, diarrhea, heartburn, melena, nausea and vomiting.       Chron's disease. "Pasty stool" s/p J-pouch surgery (2003).  Genitourinary: Negative for dysuria, frequency, hematuria and urgency.  Musculoskeletal: Positive for joint pain (hips). Negative for back pain, myalgias and neck pain.       Muscle cramps.  Skin: Negative.  Negative for itching and rash.  Neurological: Negative.  Negative for dizziness, tingling, sensory change, focal weakness, weakness and headaches.  Endo/Heme/Allergies: Negative.  Does not bruise/bleed easily.  Psychiatric/Behavioral: Negative.  Negative for depression and memory loss. The patient is not nervous/anxious and does not have insomnia.   All other systems reviewed and are negative.  Performance status (ECOG): 1  Vitals Blood pressure (!) 188/73, pulse 88, temperature (!) 97.3 F (36.3 C), temperature source Tympanic, resp. rate 18, height  5' 4"  (1.626 m), weight 299 lb 6.2 oz (135.8 kg), SpO2 100 %.  Physical Exam  Constitutional: She is oriented to person, place, and time. She appears well-developed and well-nourished. No distress.  HENT:  Head: Normocephalic and atraumatic.  Mouth/Throat: Oropharynx is clear and moist. No oropharyngeal exudate.  Short blonde hair.  Mask.  Eyes: Pupils are equal, round, and reactive to light. Conjunctivae and EOM are normal. No scleral icterus.  Glasses.  Blue eyes.  Neck: Normal range of motion. Neck supple.  Cardiovascular: Normal rate, regular rhythm and normal heart sounds. Exam reveals no gallop.  No murmur heard. Pulmonary/Chest: Effort normal and breath sounds normal. No respiratory distress. She has no wheezes. She has no rales. She exhibits no tenderness.  Abdominal: Soft. Bowel sounds are normal. She exhibits no distension and no mass. There is no abdominal tenderness. There is no rebound and no guarding.  Musculoskeletal: Normal range of motion.        General: No tenderness.  Lymphadenopathy:       Head (right side):  No preauricular, no posterior auricular and no occipital adenopathy present.       Head (left side): No preauricular, no posterior auricular and no occipital adenopathy present.    She has no cervical adenopathy.    She has no axillary adenopathy.       Right: No inguinal and no supraclavicular adenopathy present.       Left: No inguinal and no supraclavicular adenopathy present.  Neurological: She is alert and oriented to person, place, and time.  Skin: Skin is warm and dry. No rash noted. She is not diaphoretic. No erythema. No pallor.  Psychiatric: She has a normal mood and affect. Her behavior is normal. Judgment and thought content normal.  Nursing note and vitals reviewed.   Appointment on 11/04/2018  Component Date Value Ref Range Status  . Iron 11/04/2018 29  28 - 170 ug/dL Final  . TIBC 11/04/2018 467* 250 - 450 ug/dL Final  . Saturation Ratios  11/04/2018 6* 10.4 - 31.8 % Final  . UIBC 11/04/2018 438  ug/dL Final   Performed at [facility], [address], [place], [place] [address]  . Ferritin 11/04/2018 9* 11 - 307 ng/mL Final   Performed at [facility], [address] [place], [place] [address]  . WBC 11/04/2018 8.9  4.0 - 10.5 K/uL Final  . RBC 11/04/2018 4.66  3.87 - 5.11 MIL/uL Final  . Hemoglobin 11/04/2018 12.2  12.0 - 15.0 g/dL Final  . HCT 11/04/2018 38.7  36.0 - 46.0 % Final  . MCV 11/04/2018 83.0  80.0 - 100.0 fL Final  . MCH 11/04/2018 26.2  26.0 - 34.0 pg Final  . MCHC 11/04/2018 31.5  30.0 - 36.0 g/dL Final  . RDW 11/04/2018 15.0  11.5 - 15.5 % Final  . Platelets 11/04/2018 323  150 - 400 K/uL Final  . nRBC 11/04/2018 0.0  0.0 - 0.2 % Final  . Neutrophils Relative % 11/04/2018 59  % Final  . Neutro Abs 11/04/2018 5.3  1.7 - 7.7 K/uL Final  . Lymphocytes Relative 11/04/2018 27  % Final  . Lymphs Abs 11/04/2018 2.4  0.7 - 4.0 K/uL Final  . Monocytes Relative 11/04/2018 10  % Final  . Monocytes Absolute 11/04/2018 0.9  0.1 - 1.0 K/uL Final  . Eosinophils Relative 11/04/2018 3  % Final  . Eosinophils Absolute 11/04/2018 0.3  0.0 - 0.5 K/uL Final  . Basophils Relative 11/04/2018 1  % Final  . Basophils Absolute 11/04/2018 0.0  0.0 - 0.1 K/uL Final  . Immature Granulocytes 11/04/2018 0  % Final  . Abs Immature Granulocytes 11/04/2018 0.03  0.00 - 0.07 K/uL Final   Performed at [facility], [address] [place], [place] [address]    Assessment:  Donna Hart is a 63 y.o. female with Crohn's disease and recurrent iron deficiency anemia.  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  She has received Feraheme on 07/26/2015, 01/30/2016, 01/04/2017, 01/09/2017, 03/28/2017 and 10/18/2017.  Ferritin has been followed: 9 on 01/19/2011, 20 on 04/25/2011, 15 on 07/25/2011, 9 on 10/24/2011, 11 on 01/30/2012,   10 on 04/04/2015, 22 on 07/02/2013, 11 on 07/19/2015, 19 on 01/18/2016, 16 on 04/25/2016, 8 on 12/26/2016, 33 on 04/04/2017, 45 on 07/03/2017, 24 on 09/30/2017, and 9 on 11/04/2018.   Pouchoscopy at [facility] on 01/22/2018 revealed anal stricture on digital rectal exam.  Prepouch ileum was normal.  Inflammation was found in the ileoanal pouch  secondary to pouchitis.  There was inflammation at the pouch inlet secondary to pouchitis.  Symptomatically, she has been fatigued x 3 weeks.  Exam is unremarkable.  Hematocrit 38.7, hemoglobin 12.2, and MCV 83.  Ferritin was 9 with an iron saturation of 6% and a TIBC of 467.  Plan: 1.   Review labs from 11/04/2018. 2.   Iron deficiency anemia  Review entire medical history, diagnosis and management of iron deficiency anemia.  Etiology felt secondary to malabsorption.  Patient to follow-up with GI for repeat post-surgical lower GI endoscopy in 6 months (07/23/2018).  Feraheme today. 3.   RTC in 3 months for labs (CBC, ferritin, iron studies, B12, folate). 4.   RTC in 6 months for labs (CBC, ferritin, iron studies). 5.   RTC in 1 year for MD assessment, labs (CBC with diff, ferritin, iron studies- day before), and +/- Feraheme.  I discussed the assessment and treatment plan with the patient.  The patient was provided an opportunity to ask questions and all were answered.  The patient agreed with the plan and demonstrated an understanding of the instructions.  The patient was advised to call back if the symptoms worsen or if the condition fails to improve as anticipated.   Melissa C. Mike Gip, MD, PhD    11/05/2018, 3:22 PM  I, Selena Batten, am acting as scribe for [employer]. Mike Gip, MD, PhD.  I, Melissa C. Mike Gip, MD, have reviewed the above documentation for accuracy and completeness, and I agree with the above.

## 2018-11-04 ENCOUNTER — Inpatient Hospital Stay: Payer: BC Managed Care – PPO | Attending: Hematology and Oncology

## 2018-11-04 ENCOUNTER — Other Ambulatory Visit: Payer: Self-pay

## 2018-11-04 DIAGNOSIS — Z803 Family history of malignant neoplasm of breast: Secondary | ICD-10-CM | POA: Diagnosis not present

## 2018-11-04 DIAGNOSIS — K5090 Crohn's disease, unspecified, without complications: Secondary | ICD-10-CM | POA: Diagnosis not present

## 2018-11-04 DIAGNOSIS — K909 Intestinal malabsorption, unspecified: Secondary | ICD-10-CM | POA: Insufficient documentation

## 2018-11-04 DIAGNOSIS — D509 Iron deficiency anemia, unspecified: Secondary | ICD-10-CM | POA: Diagnosis not present

## 2018-11-04 LAB — CBC WITH DIFFERENTIAL/PLATELET
Abs Immature Granulocytes: 0.03 10*3/uL (ref 0.00–0.07)
Basophils Absolute: 0 10*3/uL (ref 0.0–0.1)
Basophils Relative: 1 %
Eosinophils Absolute: 0.3 10*3/uL (ref 0.0–0.5)
Eosinophils Relative: 3 %
HCT: 38.7 % (ref 36.0–46.0)
Hemoglobin: 12.2 g/dL (ref 12.0–15.0)
Immature Granulocytes: 0 %
Lymphocytes Relative: 27 %
Lymphs Abs: 2.4 10*3/uL (ref 0.7–4.0)
MCH: 26.2 pg (ref 26.0–34.0)
MCHC: 31.5 g/dL (ref 30.0–36.0)
MCV: 83 fL (ref 80.0–100.0)
Monocytes Absolute: 0.9 10*3/uL (ref 0.1–1.0)
Monocytes Relative: 10 %
Neutro Abs: 5.3 10*3/uL (ref 1.7–7.7)
Neutrophils Relative %: 59 %
Platelets: 323 10*3/uL (ref 150–400)
RBC: 4.66 MIL/uL (ref 3.87–5.11)
RDW: 15 % (ref 11.5–15.5)
WBC: 8.9 10*3/uL (ref 4.0–10.5)
nRBC: 0 % (ref 0.0–0.2)

## 2018-11-04 LAB — IRON AND TIBC
Iron: 29 ug/dL (ref 28–170)
Saturation Ratios: 6 % — ABNORMAL LOW (ref 10.4–31.8)
TIBC: 467 ug/dL — ABNORMAL HIGH (ref 250–450)
UIBC: 438 ug/dL

## 2018-11-04 LAB — FERRITIN: Ferritin: 9 ng/mL — ABNORMAL LOW (ref 11–307)

## 2018-11-04 NOTE — Progress Notes (Signed)
Confirmed Name, DOB, and Address. Previous [place] patient.  Denies any concerns.

## 2018-11-05 ENCOUNTER — Inpatient Hospital Stay: Payer: BC Managed Care – PPO

## 2018-11-05 ENCOUNTER — Inpatient Hospital Stay (HOSPITAL_BASED_OUTPATIENT_CLINIC_OR_DEPARTMENT_OTHER): Payer: BC Managed Care – PPO | Admitting: Hematology and Oncology

## 2018-11-05 ENCOUNTER — Encounter: Payer: Self-pay | Admitting: Hematology and Oncology

## 2018-11-05 VITALS — BP 188/73 | HR 88 | Temp 97.3°F | Resp 18 | Ht 64.0 in | Wt 299.4 lb

## 2018-11-05 VITALS — BP 129/71 | HR 78 | Temp 98.3°F | Resp 18

## 2018-11-05 DIAGNOSIS — D509 Iron deficiency anemia, unspecified: Secondary | ICD-10-CM | POA: Diagnosis not present

## 2018-11-05 DIAGNOSIS — D508 Other iron deficiency anemias: Secondary | ICD-10-CM

## 2018-11-05 MED ORDER — SODIUM CHLORIDE 0.9 % IV SOLN
INTRAVENOUS | Status: DC
  Administered 2018-11-05: 16:00:00 via INTRAVENOUS
  Filled 2018-11-05: qty 250

## 2018-11-05 MED ORDER — SODIUM CHLORIDE 0.9 % IV SOLN
510.0000 mg | Freq: Once | INTRAVENOUS | Status: AC
  Administered 2018-11-05: 16:00:00 510 mg via INTRAVENOUS
  Filled 2018-11-05: qty 17

## 2018-11-05 NOTE — Progress Notes (Signed)
No new changes noted today 

## 2018-11-05 NOTE — Patient Instructions (Signed)

## 2019-02-05 ENCOUNTER — Inpatient Hospital Stay: Payer: BC Managed Care – PPO

## 2019-02-06 ENCOUNTER — Encounter: Payer: Self-pay | Admitting: *Deleted

## 2019-02-09 ENCOUNTER — Other Ambulatory Visit: Payer: Self-pay

## 2019-02-10 ENCOUNTER — Inpatient Hospital Stay: Payer: Medicare Other | Attending: Hematology and Oncology

## 2019-02-10 DIAGNOSIS — D509 Iron deficiency anemia, unspecified: Secondary | ICD-10-CM | POA: Insufficient documentation

## 2019-02-10 LAB — FOLATE: Folate: 35 ng/mL (ref >5.9)

## 2019-02-10 LAB — CBC WITH DIFFERENTIAL/PLATELET
Abs Immature Granulocytes: 0.04 10*3/uL (ref 0.00–0.07)
Basophils Absolute: 0 10*3/uL (ref 0.0–0.1)
Basophils Relative: 0 %
Eosinophils Absolute: 0.3 10*3/uL (ref 0.0–0.5)
Eosinophils Relative: 3 %
HCT: 41.2 % (ref 36.0–46.0)
Hemoglobin: 13.1 g/dL (ref 12.0–15.0)
Immature Granulocytes: 0 %
Lymphocytes Relative: 18 %
Lymphs Abs: 1.8 10*3/uL (ref 0.7–4.0)
MCH: 27.4 pg (ref 26.0–34.0)
MCHC: 31.8 g/dL (ref 30.0–36.0)
MCV: 86.2 fL (ref 80.0–100.0)
Monocytes Absolute: 1 10*3/uL (ref 0.1–1.0)
Monocytes Relative: 10 %
Neutro Abs: 6.8 10*3/uL (ref 1.7–7.7)
Neutrophils Relative %: 69 %
Platelets: 296 10*3/uL (ref 150–400)
RBC: 4.78 MIL/uL (ref 3.87–5.11)
RDW: 15.5 % (ref 11.5–15.5)
WBC: 9.9 10*3/uL (ref 4.0–10.5)
nRBC: 0 % (ref 0.0–0.2)

## 2019-02-10 LAB — IRON AND TIBC
Iron: 47 ug/dL (ref 28–170)
Saturation Ratios: 12 % (ref 10.4–31.8)
TIBC: 385 ug/dL (ref 250–450)
UIBC: 338 ug/dL

## 2019-02-10 LAB — VITAMIN B12: Vitamin B-12: 458 pg/mL (ref 180–914)

## 2019-02-10 LAB — FERRITIN: Ferritin: 27 ng/mL (ref 11–307)

## 2019-03-03 ENCOUNTER — Other Ambulatory Visit: Payer: Self-pay | Admitting: Family Medicine

## 2019-03-03 DIAGNOSIS — Z1231 Encounter for screening mammogram for malignant neoplasm of breast: Secondary | ICD-10-CM

## 2019-03-03 DIAGNOSIS — Z780 Asymptomatic menopausal state: Secondary | ICD-10-CM

## 2019-04-01 ENCOUNTER — Other Ambulatory Visit: Payer: Self-pay

## 2019-04-01 ENCOUNTER — Ambulatory Visit: Payer: Medicare Other | Admitting: Podiatry

## 2019-04-01 ENCOUNTER — Encounter: Payer: Self-pay | Admitting: Podiatry

## 2019-04-01 DIAGNOSIS — L600 Ingrowing nail: Secondary | ICD-10-CM | POA: Diagnosis not present

## 2019-04-01 MED ORDER — NEOMYCIN-POLYMYXIN-HC 1 % OT SOLN: OTIC | 1 refills | Status: DC

## 2019-04-01 NOTE — Progress Notes (Signed)
Subjective:  Patient ID: Donna Hart, female    DOB: 1955/04/18,  MRN: [id number] HPI Chief Complaint  Patient presents with  . Toe Pain    Hallux bilateral - both borders (Lateral > Medial) - tender x years, nail tech usually trimmed out but has moved, trying to trim herself   . New Patient (Initial Visit)    64 y.o. female presents with the above complaint.   ROS: Denies fever chills nausea vomiting muscle aches pains calf pain back pain chest pain shortness of breath.  Past Medical History:  Diagnosis Date  . Crohn's disease ([place])   . IDA (iron deficiency anemia)   . Multinodular goiter    Past Surgical History:  Procedure Laterality Date  . CESAREAN SECTION      Current Outpatient Medications:  .  Celecoxib (CELEBREX PO), Take by mouth., Disp: , Rfl:  .  lisinopril (PRINIVIL,ZESTRIL) 10 MG tablet, Take 10 mg by mouth daily. , Disp: , Rfl:  .  acetaminophen (TYLENOL) 500 MG tablet, Take 1,000 mg by mouth every 6 (six) hours as needed. , Disp: , Rfl:  .  amLODipine (NORVASC) 5 MG tablet, Take 1 tablet by mouth daily., Disp: , Rfl: 11 .  BIOTIN PO, Take 1 tablet by mouth daily., Disp: , Rfl:  .  Calcium Carbonate-Vitamin D 600-400 MG-UNIT tablet, Take 1 tablet by mouth daily. , Disp: , Rfl:  .  levothyroxine (SYNTHROID) 137 MCG tablet, Take 137 mcg by mouth daily., Disp: , Rfl:  .  Multiple Vitamins-Minerals (MULTIVITAMIN ADULT PO), Take 1 tablet by mouth daily., Disp: , Rfl:  .  NEOMYCIN-POLYMYXIN-HYDROCORTISONE (CORTISPORIN) 1 % SOLN OTIC solution, Apply 1-2 drops to toe BID after soaking, Disp: 10 mL, Rfl: 1 .  omeprazole (PRILOSEC) 20 MG capsule, Take 20 mg by mouth daily., Disp: , Rfl:  .  Ustekinumab (STELARA [place]), Inject 90 mg into the skin every 8 (eight) weeks. , Disp: , Rfl:   Allergies  Allergen Reactions  . Levofloxacin Other (See Comments)    Achilles tendon pain  . Ciprofloxacin Palpitations    Heart Palpitations  . Morphine Rash   Review of Systems  Objective:   Vitals:   04/01/19 1105  Temp: 98.3 F (36.8 C)    General: Well developed, nourished, in no acute distress, alert and oriented x3   Dermatological: Skin is warm, dry and supple bilateral. Nails x 10 are well maintained; remaining integument appears unremarkable at this time. There are no open sores, no preulcerative lesions, no rash or signs of infection present.  Ingrown toenail fibular borders of the hallux bilaterally.  Mild erythema no cellulitis drainage or odor.  Vascular: Dorsalis Pedis artery and Posterior Tibial artery pedal pulses are 2/4 bilateral with immedate capillary fill time. Pedal hair growth present. No varicosities and no lower extremity edema present bilateral.   Neruologic: Grossly intact via light touch bilateral. Vibratory intact via tuning fork bilateral. Protective threshold with Semmes Wienstein monofilament intact to all pedal sites bilateral. Patellar and Achilles deep tendon reflexes 2+ bilateral. No Babinski or clonus noted bilateral.   Musculoskeletal: No gross boney pedal deformities bilateral. No pain, crepitus, or limitation noted with foot and ankle range of motion bilateral. Muscular strength 5/5 in all groups tested bilateral.  Gait: Unassisted, Nonantalgic.    Radiographs:  None taken  Assessment & Plan:   Assessment: Ingrown toenail fibular border hallux bilateral.  Plan: Discussed etiology pathology conservative surgical therapies chemical matrixectomy was performed to the lateral border after local  anesthetic was administered.  She tolerated procedure well without complications.  Was provided with both oral and written home-going instructions for care and soaking of the toes.  Was provided a prescription for Cortisporin Otic to be applied twice daily after soaking and will follow up with me in about 2 weeks.      T. Josephine, [place]

## 2019-04-01 NOTE — Patient Instructions (Signed)

## 2019-04-22 ENCOUNTER — Other Ambulatory Visit: Payer: Self-pay

## 2019-04-22 ENCOUNTER — Encounter: Payer: Self-pay | Admitting: Podiatry

## 2019-04-22 ENCOUNTER — Ambulatory Visit: Payer: Medicare Other | Admitting: Podiatry

## 2019-04-22 VITALS — Temp 98.2°F

## 2019-04-22 DIAGNOSIS — L03031 Cellulitis of right toe: Secondary | ICD-10-CM

## 2019-04-22 MED ORDER — DOXYCYCLINE HYCLATE 100 MG PO TABS: 100.0000 mg | ORAL_TABLET | Freq: Two times a day (BID) | ORAL | 0 refills | Status: DC

## 2019-04-22 NOTE — Progress Notes (Signed)
She presents today for follow-up of her matrixectomy's hallux bilateral states that the right one started draining after I got back from the beach the other day internal red and tender so starting to soak it the scab came off and there was some drainage present.  There is no significant pain with it.  Objective: Vital signs are stable alert oriented times there is mild erythema mild purulence on the fibular border of the hallux right hallux left is completely healed.  Assessment: Mild paronychia abscess hallux right.  Plan: At this point we will start her on doxycycline and encourage Epson salts and warm water soaks twice daily cover during the daytime but leave open at bedtime.

## 2019-05-06 ENCOUNTER — Other Ambulatory Visit: Payer: Self-pay

## 2019-05-06 ENCOUNTER — Inpatient Hospital Stay: Payer: Medicare Other | Attending: Hematology and Oncology

## 2019-05-06 DIAGNOSIS — D509 Iron deficiency anemia, unspecified: Secondary | ICD-10-CM | POA: Insufficient documentation

## 2019-05-06 LAB — CBC
HCT: 41.5 % (ref 36.0–46.0)
Hemoglobin: 13.1 g/dL (ref 12.0–15.0)
MCH: 27.1 pg (ref 26.0–34.0)
MCHC: 31.6 g/dL (ref 30.0–36.0)
MCV: 85.9 fL (ref 80.0–100.0)
Platelets: 292 10*3/uL (ref 150–400)
RBC: 4.83 MIL/uL (ref 3.87–5.11)
RDW: 14.4 % (ref 11.5–15.5)
WBC: 6.5 10*3/uL (ref 4.0–10.5)
nRBC: 0 % (ref 0.0–0.2)

## 2019-05-06 LAB — IRON AND TIBC
Iron: 37 ug/dL (ref 28–170)
Saturation Ratios: 9 % — ABNORMAL LOW (ref 10.4–31.8)
TIBC: 400 ug/dL (ref 250–450)
UIBC: 363 ug/dL

## 2019-05-06 LAB — FERRITIN: Ferritin: 20 ng/mL (ref 11–307)

## 2019-05-07 ENCOUNTER — Telehealth: Payer: Self-pay

## 2019-05-07 NOTE — Telephone Encounter (Signed)
-----   Message from Lequita Asal, MD sent at 05/06/2019  5:15 PM EDT ----- Regarding: Please call patient  Ferritin has drifted down to 20 with an iron saturation of 9%.  No anemia.  HCT is 41.  Any symptoms?  M  ----- Message ----- From: Buel Ream, Lab In [place] Sent: 05/06/2019   8:07 AM EDT To: Lequita Asal, MD

## 2019-06-05 ENCOUNTER — Other Ambulatory Visit: Payer: Self-pay | Admitting: Certified Nurse Midwife

## 2019-06-05 DIAGNOSIS — Z1231 Encounter for screening mammogram for malignant neoplasm of breast: Secondary | ICD-10-CM

## 2019-06-11 ENCOUNTER — Other Ambulatory Visit: Payer: Self-pay

## 2019-06-11 ENCOUNTER — Ambulatory Visit
Admission: RE | Admit: 2019-06-11 | Discharge: 2019-06-11 | Disposition: A | Discharge status: Home / Self Care | Payer: Medicare Other | Source: Ambulatory Visit | Attending: Certified Nurse Midwife | Admitting: Certified Nurse Midwife

## 2019-06-11 DIAGNOSIS — Z1231 Encounter for screening mammogram for malignant neoplasm of breast: Secondary | ICD-10-CM | POA: Diagnosis not present

## 2019-06-11 HISTORY — DX: Malignant (primary) neoplasm, unspecified: C80.1

## 2019-09-24 ENCOUNTER — Telehealth: Payer: Self-pay

## 2019-09-24 ENCOUNTER — Other Ambulatory Visit: Payer: Self-pay

## 2019-09-24 ENCOUNTER — Inpatient Hospital Stay: Payer: Medicare Other | Attending: Hematology and Oncology

## 2019-09-24 DIAGNOSIS — D509 Iron deficiency anemia, unspecified: Secondary | ICD-10-CM | POA: Diagnosis not present

## 2019-09-24 DIAGNOSIS — D508 Other iron deficiency anemias: Secondary | ICD-10-CM

## 2019-09-24 LAB — CBC
HCT: 39.2 % (ref 36.0–46.0)
Hemoglobin: 12.1 g/dL (ref 12.0–15.0)
MCH: 25.7 pg — ABNORMAL LOW (ref 26.0–34.0)
MCHC: 30.9 g/dL (ref 30.0–36.0)
MCV: 83.4 fL (ref 80.0–100.0)
Platelets: 358 10*3/uL (ref 150–400)
RBC: 4.7 MIL/uL (ref 3.87–5.11)
RDW: 15.1 % (ref 11.5–15.5)
WBC: 8.2 10*3/uL (ref 4.0–10.5)
nRBC: 0 % (ref 0.0–0.2)

## 2019-09-24 LAB — FERRITIN: Ferritin: 13 ng/mL (ref 11–307)

## 2019-09-24 NOTE — Telephone Encounter (Signed)
-----   Message from Lequita Asal, MD sent at 09/23/2019  5:41 PM EDT ----- Regarding: RE: lab  She will need CBC with diff, ferritin, iron studies, and hold tube.  She may need to go to Urgent Care.  M ----- Message ----- From: Secundino Ginger Sent: 09/23/2019   2:01 PM EDT To: Lequita Asal, MD, Vito Berger, CMA Subject: lab                                            This patient called and asked if she could come for labs. She is extremely tired, Short of breath and heart racing. I put her on for tomorrow if someone can put labs in for me please.

## 2019-09-24 NOTE — Telephone Encounter (Signed)
The patient was in office this morning and states she was feeling better, i informed her per Dr Mike Gip she would like for her to be seen in Urgent Care. The patient stated she is not going to urgent care and  that she is better today. She states she will wait on a call about her labs. The patient is understanding and agreeable.

## 2019-09-25 ENCOUNTER — Telehealth: Payer: Self-pay

## 2019-09-25 NOTE — Telephone Encounter (Signed)
-----   Message from Lequita Asal, MD sent at 09/24/2019  4:58 PM EDT ----- Regarding: Please call patient  Ferritin is low (13), but currently not anemic.  She can receive Feraheme x 1.  However, it is unclear if this is the cause of her symptoms as she is not anemic.  M ----- Message ----- From: Interface, Lab In [place] Sent: 09/24/2019   8:25 AM EDT To: Lequita Asal, MD

## 2019-09-25 NOTE — Telephone Encounter (Signed)
Spoke with the patient to inform her thatPer Dr Mike Gip her Ferritin levbels were 13 and she will need 1 dose of IV Feraheme. The patient was understanding and agreeable. I have sent a message to Shirlean Mylar to get the patient schedule.

## 2019-09-30 ENCOUNTER — Inpatient Hospital Stay: Payer: Medicare Other

## 2019-09-30 ENCOUNTER — Other Ambulatory Visit: Payer: Self-pay

## 2019-09-30 VITALS — BP 133/72 | HR 78 | Temp 96.9°F | Resp 18

## 2019-09-30 DIAGNOSIS — D508 Other iron deficiency anemias: Secondary | ICD-10-CM

## 2019-09-30 DIAGNOSIS — D509 Iron deficiency anemia, unspecified: Secondary | ICD-10-CM | POA: Diagnosis not present

## 2019-09-30 MED ORDER — SODIUM CHLORIDE 0.9 % IV SOLN
Freq: Once | INTRAVENOUS | Status: AC
  Filled 2019-09-30: qty 250

## 2019-09-30 MED ORDER — SODIUM CHLORIDE 0.9 % IV SOLN
510.0000 mg | Freq: Once | INTRAVENOUS | Status: AC
  Administered 2019-09-30: 510 mg via INTRAVENOUS
  Filled 2019-09-30: qty 17

## 2019-11-03 ENCOUNTER — Other Ambulatory Visit: Payer: BC Managed Care – PPO

## 2019-11-04 ENCOUNTER — Ambulatory Visit: Payer: BC Managed Care – PPO

## 2019-11-04 ENCOUNTER — Ambulatory Visit: Payer: BC Managed Care – PPO | Admitting: Hematology and Oncology

## 2019-11-24 ENCOUNTER — Other Ambulatory Visit: Payer: Self-pay

## 2019-11-24 DIAGNOSIS — D508 Other iron deficiency anemias: Secondary | ICD-10-CM

## 2019-11-25 ENCOUNTER — Inpatient Hospital Stay: Payer: Medicare Other

## 2019-11-25 ENCOUNTER — Telehealth: Payer: Self-pay | Admitting: Hematology and Oncology

## 2019-11-25 ENCOUNTER — Inpatient Hospital Stay: Payer: Medicare Other | Admitting: Hematology and Oncology

## 2019-12-09 NOTE — Progress Notes (Signed)
[facility]  [address] [place], [place] [address] Phone: [phone]  Fax: [phone]   Clinic Day:  12/10/2019  Referring physician: Gayland Curry, MD  Chief Complaint: Donna Hart is a 64 y.o. female with Crohn's disease and iron deficiency anemia who is seen for 1 year assessment.   HPI:  The patient was last seen in the hematology clinic on 11/05/2018 for new patient assessment. At that time, she has been fatigued x 3 weeks.  Exam is unremarkable.  Hematocrit 38.7, hemoglobin 12.2, MCV 83, platelets 323,000, WBC 8,900.  Ferritin was 9 with an iron saturation of 6% and a TIBC of 467. She received Feraheme.  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 There were few shallow ulcers in the body of the pouch. There was a 4 cm segment of moderate inflammation in the pre-pouch ileum just above the pouch inlet (this looked worse than prior).   Screening mammogram on 06/11/2019 revealed no evidence of malignancy.  Labs followed: 02/10/2019: Hematocrit 41.2, hemoglobin 13.1, platelets 296,000, WBC 9,900. Ferritin 27. Iron saturation 12%. TIBC 385. 05/06/2019: Hematocrit 41.5, hemoglobin 13.1, platelets 292,000, WBC 6,500. Ferritin 20. Iron saturation   9%. TIBC 400. 09/24/2019: Hematocrit 39.2, hemoglobin 12.1, platelets 358,000, WBC 8,200. Ferritin 13.  Additional labs: Vitamin B12 was 458 and folate was 35.0 on 02/10/2019.  She received Feraheme on 09/30/2019.  During the interim, she has been "okay." The patient is struggling emotionally because she has two ill parents who live in [place]. Her shortness of breath is "ok". She gets muscle cramps when she is dehydrated. Her stools are normal for her. She denies diarrhea, blood in the stool, and black stool.  She states that she is going to participate in a trial where they use stem cells to fix fistulas.  She would like to get her flu vaccine today if possible.   Past Medical  History:  Diagnosis Date  . Cancer ([place])    thyroid  . Crohn's disease ([place])   . IDA (iron deficiency anemia)   . Multinodular goiter     Past Surgical History:  Procedure Laterality Date  . CESAREAN SECTION      Family History  Problem Relation Age of Onset  . Breast cancer Maternal Aunt 52  Her father was suspected to have an autoimmune disease. Her sister has ulcerative colitis.    Social History:  reports that she has never smoked. She has never used smokeless tobacco. She reports previous alcohol use. She reports that she does not use drugs.  She drinks about 2 glasses of wine a week. She denies any exposure to radiations or toxins. 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  She lives in [place].  The patient is alone today.  Allergies:  Allergies  Allergen Reactions  . Levofloxacin Other (See Comments)    Achilles tendon pain  . Ciprofloxacin Palpitations    Heart Palpitations  . Morphine Rash    Current Medications: Current Outpatient Medications  Medication Sig Dispense Refill  . amLODipine (NORVASC) 5 MG tablet Take 1 tablet by mouth daily.  11  . BIOTIN PO Take 1 tablet by mouth daily.    . Calcium Carbonate-Vitamin D 600-400 MG-UNIT tablet Take 1 tablet by mouth daily.     . Celecoxib (CELEBREX PO) Take by mouth.    . levothyroxine (SYNTHROID) 137 MCG tablet Take 137 mcg by mouth daily.    Marland Kitchen lisinopril (PRINIVIL,ZESTRIL) 10 MG tablet Take 10  mg by mouth daily.     . Multiple Vitamins-Minerals (MULTIVITAMIN ADULT PO) Take 1 tablet by mouth daily.    Marland Kitchen omeprazole (PRILOSEC) 20 MG capsule Take 20 mg by mouth daily.    Marland Kitchen Ustekinumab (STELARA [place]) Inject 90 mg into the skin every 8 (eight) weeks.      No current facility-administered medications for this visit.   Review of Systems  Constitutional: Positive for weight loss (8 lbs). Negative for chills, diaphoresis, fever and malaise/fatigue.       Feels "okay".  HENT: Negative.   Negative for congestion, ear discharge, ear pain, hearing loss, nosebleeds, sinus pain, sore throat and tinnitus.   Eyes: Negative.  Negative for blurred vision and double vision.  Respiratory: Positive for shortness of breath. Negative for cough, hemoptysis and sputum production.   Cardiovascular: Negative.  Negative for chest pain, palpitations and leg swelling.  Gastrointestinal: Negative for abdominal pain, blood in stool, constipation, diarrhea, heartburn, melena, nausea and vomiting.       Chron's disease. "Pasty stool" s/p J-pouch surgery (2003).  Genitourinary: Negative.  Negative for dysuria, frequency, hematuria and urgency.  Musculoskeletal: Positive for myalgias (muscle cramps when she gets dehydrated). Negative for back pain, joint pain and neck pain.  Skin: Negative.  Negative for itching and rash.  Neurological: Negative.  Negative for dizziness, tingling, sensory change, focal weakness, weakness and headaches.  Endo/Heme/Allergies: Negative.  Does not bruise/bleed easily.  Psychiatric/Behavioral: Negative.  Negative for depression and memory loss. The patient is not nervous/anxious and does not have insomnia.   All other systems reviewed and are negative.  Performance status (ECOG): 1  Vitals Blood pressure 139/69, pulse 69, temperature 98.3 F (36.8 C), temperature source Oral, resp. rate 18, weight 291 lb 10.7 oz (132.3 kg), SpO2 99 %.  Physical Exam Vitals and nursing note reviewed.  Constitutional:      General: She is not in acute distress.    Appearance: She is well-developed. She is not diaphoretic.  HENT:     Head: Normocephalic and atraumatic.     Mouth/Throat:     Pharynx: No oropharyngeal exudate.  Eyes:     General: No scleral icterus.    Conjunctiva/sclera: Conjunctivae normal.     Pupils: Pupils are equal, round, and reactive to light.     Comments: Glasses.  Blue eyes.  Cardiovascular:     Rate and Rhythm: Normal rate and regular rhythm.     Heart  sounds: Normal heart sounds. No murmur heard.  No gallop.   Pulmonary:     Effort: Pulmonary effort is normal. No respiratory distress.     Breath sounds: Normal breath sounds. No wheezing or rales.  Chest:     Chest wall: No tenderness.  Abdominal:     General: Bowel sounds are normal. There is no distension.     Palpations: Abdomen is soft. There is no mass.     Tenderness: There is no abdominal tenderness. There is no guarding or rebound.  Musculoskeletal:        General: No tenderness. Normal range of motion.     Cervical back: Normal range of motion and neck supple.  Lymphadenopathy:     Head:     Right side of head: No preauricular, posterior auricular or occipital adenopathy.     Left side of head: No preauricular, posterior auricular or occipital adenopathy.     Cervical: No cervical adenopathy.     Upper Body:     Right upper body: No  supraclavicular adenopathy.     Left upper body: No supraclavicular adenopathy.     Lower Body: No right inguinal adenopathy. No left inguinal adenopathy.  Skin:    General: Skin is warm and dry.     Coloration: Skin is not pale.     Findings: No erythema or rash.  Neurological:     Mental Status: She is alert and oriented to person, place, and time.  Psychiatric:        Behavior: Behavior normal.        Thought Content: Thought content normal.        Judgment: Judgment normal.    Appointment on 12/10/2019  Component Date Value Ref Range Status  . WBC 12/10/2019 8.0  4.0 - 10.5 K/uL Final  . RBC 12/10/2019 4.89  3.87 - 5.11 MIL/uL Final  . Hemoglobin 12/10/2019 13.2  12.0 - 15.0 g/dL Final  . HCT 12/10/2019 41.0  36 - 46 % Final  . MCV 12/10/2019 83.8  80.0 - 100.0 fL Final  . MCH 12/10/2019 27.0  26.0 - 34.0 pg Final  . MCHC 12/10/2019 32.2  30.0 - 36.0 g/dL Final  . RDW 12/10/2019 16.4* 11.5 - 15.5 % Final  . Platelets 12/10/2019 305  150 - 400 K/uL Final  . nRBC 12/10/2019 0.0  0.0 - 0.2 % Final  . Neutrophils Relative %  12/10/2019 67  % Final  . Neutro Abs 12/10/2019 5.4  1.7 - 7.7 K/uL Final  . Lymphocytes Relative 12/10/2019 21  % Final  . Lymphs Abs 12/10/2019 1.6  0.7 - 4.0 K/uL Final  . Monocytes Relative 12/10/2019 9  % Final  . Monocytes Absolute 12/10/2019 0.7  0.1 - 1.0 K/uL Final  . Eosinophils Relative 12/10/2019 2  % Final  . Eosinophils Absolute 12/10/2019 0.2  0.0 - 0.5 K/uL Final  . Basophils Relative 12/10/2019 1  % Final  . Basophils Absolute 12/10/2019 0.1  0.0 - 0.1 K/uL Final  . Immature Granulocytes 12/10/2019 0  % Final  . Abs Immature Granulocytes 12/10/2019 0.03  0.00 - 0.07 K/uL Final   Performed at [facility], [address], [place], [place] [address]    Assessment:  Donna Hart is a 64 y.o. female with Crohn's disease and recurrent iron deficiency anemia.  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  She has received Feraheme on 07/26/2015, 01/30/2016, 01/04/2017, 01/09/2017, 03/28/2017, 10/18/2017, 11/05/2018, and 09/30/2019.  Ferritin has been followed: 9 on 01/19/2011, 20 on 04/25/2011, 15 on 07/25/2011, 9 on 10/24/2011, 11 on 01/30/2012,  10 on 04/04/2015, 22 on 07/02/2013, 11 on 07/19/2015, 19 on 01/18/2016, 16 on 04/25/2016, 8 on 12/26/2016, 33 on 04/04/2017, 45 on 07/03/2017, 24 on 09/30/2017, 9 on 11/04/2018, 27 on 02/10/2019, 20 on 05/06/2019, and 13 on 09/24/2019.   Pouchoscopy at [facility] on 01/22/2018 revealed anal stricture on digital rectal exam.  Prepouch ileum was normal.  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  There was inflammation at the pouch inlet secondary to pouchitis.  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 There were few shallow ulcers in the body of the pouch. There was a 4 cm segment of moderate inflammation in the pre-pouch ileum just above the pouch inlet (this looked worse than prior).   She received the [place]  COVID-19 vaccine on 02/03/2019 and 02/24/2019. She also received the Booster shot in 10/2019.  Symptomatically,  "ok." She denies diarrhea, blood in the stool, and  black stool.  Exam is stable.  Plan: 1.   Labs today:  CBC with diff, ferritin, iron studies. 2.   Iron deficiency anemia  Hematocrit 41.0.  Hemoglobin 13.2.  MCV 83.8 today.   Ferritin 19 with an iron saturation of 14% and a TIBC of 426 (available after clinic).  Etiology felt secondary to malabsorption.  Pouchoscopy on 11/27/2018 revealed a few shallow ulcers in the body of the pouch.    There was a 4 cm segment of moderate inflammation in the pre-pouch ileum.  3.   Influenza vaccine. 4.   RN:  Call patient with ferritin level. 5.   RTC in 3 and 6 months for labs (CBC, ferritin, iron studies). 6.   RTC in 1 year for MD assessment, labs (CBC with diff, ferritin, iron studies- day before), and +/- Feraheme.  I discussed the assessment and treatment plan with the patient.  The patient was provided an opportunity to ask questions and all were answered.  The patient agreed with the plan and demonstrated an understanding of the instructions.  The patient was advised to call back if the symptoms worsen or if the condition fails to improve as anticipated.   Massiel Stipp C. Mike Gip, MD, PhD    12/10/2019, 9:49 AM  I, Mirian Mo Tufford, am acting as Education administrator for [employer]. Mike Gip, MD, PhD.  I, Pierre Cumpton C. Mike Gip, MD, have reviewed the above documentation for accuracy and completeness, and I agree with the above.

## 2019-12-10 ENCOUNTER — Inpatient Hospital Stay: Payer: Medicare Other | Attending: Hematology and Oncology

## 2019-12-10 ENCOUNTER — Inpatient Hospital Stay: Payer: Medicare Other | Admitting: Hematology and Oncology

## 2019-12-10 ENCOUNTER — Encounter: Payer: Self-pay | Admitting: Hematology and Oncology

## 2019-12-10 ENCOUNTER — Other Ambulatory Visit: Payer: Self-pay

## 2019-12-10 ENCOUNTER — Inpatient Hospital Stay: Payer: Medicare Other

## 2019-12-10 VITALS — BP 139/69 | HR 69 | Temp 98.3°F | Resp 18 | Wt 291.7 lb

## 2019-12-10 DIAGNOSIS — D509 Iron deficiency anemia, unspecified: Secondary | ICD-10-CM | POA: Diagnosis not present

## 2019-12-10 DIAGNOSIS — Z23 Encounter for immunization: Secondary | ICD-10-CM

## 2019-12-10 DIAGNOSIS — K5090 Crohn's disease, unspecified, without complications: Secondary | ICD-10-CM | POA: Diagnosis not present

## 2019-12-10 DIAGNOSIS — D508 Other iron deficiency anemias: Secondary | ICD-10-CM

## 2019-12-10 LAB — CBC WITH DIFFERENTIAL/PLATELET
Abs Immature Granulocytes: 0.03 10*3/uL (ref 0.00–0.07)
Basophils Absolute: 0.1 10*3/uL (ref 0.0–0.1)
Basophils Relative: 1 %
Eosinophils Absolute: 0.2 10*3/uL (ref 0.0–0.5)
Eosinophils Relative: 2 %
HCT: 41 % (ref 36.0–46.0)
Hemoglobin: 13.2 g/dL (ref 12.0–15.0)
Immature Granulocytes: 0 %
Lymphocytes Relative: 21 %
Lymphs Abs: 1.6 10*3/uL (ref 0.7–4.0)
MCH: 27 pg (ref 26.0–34.0)
MCHC: 32.2 g/dL (ref 30.0–36.0)
MCV: 83.8 fL (ref 80.0–100.0)
Monocytes Absolute: 0.7 10*3/uL (ref 0.1–1.0)
Monocytes Relative: 9 %
Neutro Abs: 5.4 10*3/uL (ref 1.7–7.7)
Neutrophils Relative %: 67 %
Platelets: 305 10*3/uL (ref 150–400)
RBC: 4.89 MIL/uL (ref 3.87–5.11)
RDW: 16.4 % — ABNORMAL HIGH (ref 11.5–15.5)
WBC: 8 10*3/uL (ref 4.0–10.5)
nRBC: 0 % (ref 0.0–0.2)

## 2019-12-10 LAB — IRON AND TIBC
Iron: 59 ug/dL (ref 28–170)
Saturation Ratios: 14 % (ref 10.4–31.8)
TIBC: 426 ug/dL (ref 250–450)
UIBC: 367 ug/dL

## 2019-12-10 LAB — FERRITIN: Ferritin: 19 ng/mL (ref 11–307)

## 2019-12-10 MED ORDER — INFLUENZA VAC A&B SA ADJ QUAD 0.5 ML IM PRSY
0.5000 mL | PREFILLED_SYRINGE | Freq: Once | INTRAMUSCULAR | Status: AC
  Administered 2019-12-10: 0.5 mL via INTRAMUSCULAR
  Filled 2019-12-10: qty 0.5

## 2019-12-10 NOTE — Progress Notes (Signed)
Pt in for follow up, denies any medical concerns.  States having stress with elderly parents being ill and out of state.

## 2019-12-15 ENCOUNTER — Telehealth: Payer: Self-pay

## 2019-12-15 ENCOUNTER — Other Ambulatory Visit: Payer: Self-pay

## 2019-12-15 DIAGNOSIS — D509 Iron deficiency anemia, unspecified: Secondary | ICD-10-CM

## 2019-12-15 DIAGNOSIS — D508 Other iron deficiency anemias: Secondary | ICD-10-CM

## 2019-12-15 DIAGNOSIS — Z23 Encounter for immunization: Secondary | ICD-10-CM

## 2019-12-15 NOTE — Telephone Encounter (Signed)
Patient aware, lab orders in, robin please call and make appt, thank you

## 2019-12-15 NOTE — Telephone Encounter (Signed)
-----   Message from Lequita Asal, MD sent at 12/15/2019  8:39 AM EST ----- Regarding: Please call patient  Ferritin is 19.  Iron saturation normal.  Hemoglobin is great.  Consider repeat CBC, ferritin, and iron studies in 6 weeks.  M ----- Message ----- From: Buel Ream, Lab In [place] Sent: 12/10/2019   9:37 AM EST To: Lequita Asal, MD

## 2020-01-25 ENCOUNTER — Inpatient Hospital Stay: Payer: Medicare Other

## 2020-01-27 ENCOUNTER — Other Ambulatory Visit: Payer: Self-pay | Admitting: Hematology and Oncology

## 2020-01-27 ENCOUNTER — Telehealth: Payer: Self-pay

## 2020-01-27 ENCOUNTER — Other Ambulatory Visit: Payer: Self-pay

## 2020-01-27 ENCOUNTER — Inpatient Hospital Stay: Payer: Medicare Other | Attending: Hematology and Oncology

## 2020-01-27 DIAGNOSIS — D509 Iron deficiency anemia, unspecified: Secondary | ICD-10-CM | POA: Diagnosis not present

## 2020-01-27 LAB — CBC WITH DIFFERENTIAL/PLATELET
Abs Immature Granulocytes: 0.03 10*3/uL (ref 0.00–0.07)
Basophils Absolute: 0 10*3/uL (ref 0.0–0.1)
Basophils Relative: 1 %
Eosinophils Absolute: 0.2 10*3/uL (ref 0.0–0.5)
Eosinophils Relative: 3 %
HCT: 40.6 % (ref 36.0–46.0)
Hemoglobin: 13 g/dL (ref 12.0–15.0)
Immature Granulocytes: 0 %
Lymphocytes Relative: 21 %
Lymphs Abs: 1.5 10*3/uL (ref 0.7–4.0)
MCH: 27.1 pg (ref 26.0–34.0)
MCHC: 32 g/dL (ref 30.0–36.0)
MCV: 84.6 fL (ref 80.0–100.0)
Monocytes Absolute: 0.7 10*3/uL (ref 0.1–1.0)
Monocytes Relative: 9 %
Neutro Abs: 5 10*3/uL (ref 1.7–7.7)
Neutrophils Relative %: 66 %
Platelets: 316 10*3/uL (ref 150–400)
RBC: 4.8 MIL/uL (ref 3.87–5.11)
RDW: 14.9 % (ref 11.5–15.5)
WBC: 7.4 10*3/uL (ref 4.0–10.5)
nRBC: 0 % (ref 0.0–0.2)

## 2020-01-27 LAB — IRON AND TIBC
Iron: 52 ug/dL (ref 28–170)
Saturation Ratios: 11 % (ref 10.4–31.8)
TIBC: 463 ug/dL — ABNORMAL HIGH (ref 250–450)
UIBC: 411 ug/dL

## 2020-01-27 LAB — FERRITIN: Ferritin: 16 ng/mL (ref 11–307)

## 2020-01-27 NOTE — Telephone Encounter (Signed)
Called patient to go over lab results and the patient was not available left a message to call back

## 2020-01-27 NOTE — Telephone Encounter (Signed)
-----   Message from Drue Dun, RN sent at 01/27/2020  4:34 PM EST ----- Regarding: FW: Please call patient See below ----- Message ----- From: Lequita Asal, MD Sent: 01/27/2020   4:12 PM EST To: Luella Cook, RN, Roxie Eusebio Me, RN Subject: Please call patient                             Ferritin 16 (low).  Hemoglobin is normal.  Any symptoms?  Consider Feraheme x 1.  M  ----- Message ----- From: Buel Ream, Lab In [place] Sent: 01/27/2020   9:14 AM EST To: Lequita Asal, MD

## 2020-02-02 ENCOUNTER — Telehealth: Payer: Self-pay | Admitting: *Deleted

## 2020-02-02 NOTE — Telephone Encounter (Signed)
-----   Message from Lequita Asal, MD sent at 01/27/2020  4:10 PM EST ----- Regarding: Please call patient  Ferritin 16 (low).  Hemoglobin is normal.  Any symptoms?  Consider Feraheme x 1.  M  ----- Message ----- From: Buel Ream, Lab In [place] Sent: 01/27/2020   9:14 AM EST To: Lequita Asal, MD

## 2020-02-02 NOTE — Telephone Encounter (Signed)
Called and I spoke to the pt. And she says that she has been doing this anemia for a while. She does not feel that she needs iron treatment now. She will call if she starts feeling bad

## 2020-03-08 ENCOUNTER — Telehealth: Payer: Self-pay | Admitting: Hematology and Oncology

## 2020-03-08 NOTE — Telephone Encounter (Signed)
Cancelling labs due to death in family  Patient stated she will call back to reschedule

## 2020-03-09 ENCOUNTER — Inpatient Hospital Stay: Payer: Medicare Other

## 2020-06-08 ENCOUNTER — Other Ambulatory Visit: Payer: Self-pay

## 2020-06-08 DIAGNOSIS — D509 Iron deficiency anemia, unspecified: Secondary | ICD-10-CM

## 2020-06-08 DIAGNOSIS — Z23 Encounter for immunization: Secondary | ICD-10-CM

## 2020-06-09 ENCOUNTER — Other Ambulatory Visit: Payer: Self-pay

## 2020-06-09 ENCOUNTER — Inpatient Hospital Stay: Payer: Medicare Other | Attending: Oncology

## 2020-06-09 DIAGNOSIS — D509 Iron deficiency anemia, unspecified: Secondary | ICD-10-CM | POA: Insufficient documentation

## 2020-06-09 LAB — IRON AND TIBC
Iron: 52 ug/dL (ref 28–170)
Saturation Ratios: 11 % (ref 10.4–31.8)
TIBC: 490 ug/dL — ABNORMAL HIGH (ref 250–450)
UIBC: 438 ug/dL

## 2020-06-09 LAB — CBC WITH DIFFERENTIAL/PLATELET
Abs Immature Granulocytes: 0.03 10*3/uL (ref 0.00–0.07)
Basophils Absolute: 0.1 10*3/uL (ref 0.0–0.1)
Basophils Relative: 1 %
Eosinophils Absolute: 0.2 10*3/uL (ref 0.0–0.5)
Eosinophils Relative: 2 %
HCT: 38.9 % (ref 36.0–46.0)
Hemoglobin: 12 g/dL (ref 12.0–15.0)
Immature Granulocytes: 0 %
Lymphocytes Relative: 27 %
Lymphs Abs: 2.2 10*3/uL (ref 0.7–4.0)
MCH: 25.7 pg — ABNORMAL LOW (ref 26.0–34.0)
MCHC: 30.8 g/dL (ref 30.0–36.0)
MCV: 83.3 fL (ref 80.0–100.0)
Monocytes Absolute: 0.7 10*3/uL (ref 0.1–1.0)
Monocytes Relative: 9 %
Neutro Abs: 4.9 10*3/uL (ref 1.7–7.7)
Neutrophils Relative %: 61 %
Platelets: 355 10*3/uL (ref 150–400)
RBC: 4.67 MIL/uL (ref 3.87–5.11)
RDW: 15 % (ref 11.5–15.5)
WBC: 8.1 10*3/uL (ref 4.0–10.5)
nRBC: 0 % (ref 0.0–0.2)

## 2020-06-09 LAB — FERRITIN: Ferritin: 8 ng/mL — ABNORMAL LOW (ref 11–307)

## 2020-06-13 ENCOUNTER — Telehealth: Payer: Self-pay | Admitting: Oncology

## 2020-06-13 NOTE — Telephone Encounter (Signed)
Patient called stating that she tested positive for COVID on 6/5 (after developing a fever)--she is scheduled for an iron infusion 6/7. She wants clarification on how many days out she needs to reschedule (when she is no longer symptomatic) because she will be out of town the week of 6/20 and would prefer not to wait until her subsequent appt date on 6/28.  Please advice, [name]

## 2020-06-13 NOTE — Telephone Encounter (Signed)
R/s appt out 10 days, she should be good to get before 6/20. Keep appt on 6/28 as scheduled please.

## 2020-06-13 NOTE — Telephone Encounter (Signed)
Left VM with patient to notify her that per RN she can be rescheduled 10 days out (pt tested pos for COVID). Informed her of new day/time and requested she please call back if still symptomatic at that time.

## 2020-06-14 ENCOUNTER — Inpatient Hospital Stay: Payer: Medicare Other

## 2020-06-24 ENCOUNTER — Other Ambulatory Visit: Payer: Self-pay

## 2020-06-24 ENCOUNTER — Inpatient Hospital Stay: Payer: Medicare Other

## 2020-06-24 VITALS — BP 164/66 | HR 75 | Temp 97.7°F | Resp 18

## 2020-06-24 DIAGNOSIS — D508 Other iron deficiency anemias: Secondary | ICD-10-CM

## 2020-06-24 DIAGNOSIS — D509 Iron deficiency anemia, unspecified: Secondary | ICD-10-CM | POA: Diagnosis not present

## 2020-06-24 MED ORDER — SODIUM CHLORIDE 0.9 % IV SOLN
510.0000 mg | Freq: Once | INTRAVENOUS | Status: AC
  Administered 2020-06-24: 510 mg via INTRAVENOUS
  Filled 2020-06-24: qty 17

## 2020-06-24 MED ORDER — SODIUM CHLORIDE 0.9 % IV SOLN
Freq: Once | INTRAVENOUS | Status: AC
  Filled 2020-06-24: qty 250

## 2020-07-05 ENCOUNTER — Inpatient Hospital Stay: Payer: Medicare Other

## 2020-07-05 ENCOUNTER — Other Ambulatory Visit: Payer: Self-pay

## 2020-07-05 VITALS — BP 131/78 | HR 78 | Temp 97.2°F | Resp 18

## 2020-07-05 DIAGNOSIS — D508 Other iron deficiency anemias: Secondary | ICD-10-CM

## 2020-07-05 DIAGNOSIS — D509 Iron deficiency anemia, unspecified: Secondary | ICD-10-CM | POA: Diagnosis not present

## 2020-07-05 MED ORDER — FERUMOXYTOL INJECTION 510 MG/17 ML
510.0000 mg | Freq: Once | INTRAVENOUS | Status: AC
  Administered 2020-07-05: 510 mg via INTRAVENOUS
  Filled 2020-07-05: qty 17

## 2020-07-05 MED ORDER — SODIUM CHLORIDE 0.9 % IV SOLN
Freq: Once | INTRAVENOUS | Status: AC
  Filled 2020-07-05: qty 250

## 2020-08-24 ENCOUNTER — Other Ambulatory Visit: Payer: Self-pay | Admitting: Family Medicine

## 2020-08-24 DIAGNOSIS — Z1231 Encounter for screening mammogram for malignant neoplasm of breast: Secondary | ICD-10-CM

## 2020-08-24 DIAGNOSIS — Z780 Asymptomatic menopausal state: Secondary | ICD-10-CM

## 2020-10-12 ENCOUNTER — Other Ambulatory Visit: Payer: Self-pay

## 2020-10-12 ENCOUNTER — Ambulatory Visit
Admission: RE | Admit: 2020-10-12 | Discharge: 2020-10-12 | Disposition: A | Discharge status: Home / Self Care | Payer: Medicare Other | Source: Ambulatory Visit | Attending: Family Medicine | Admitting: Family Medicine

## 2020-10-12 DIAGNOSIS — Z1231 Encounter for screening mammogram for malignant neoplasm of breast: Secondary | ICD-10-CM | POA: Insufficient documentation

## 2020-10-12 DIAGNOSIS — Z780 Asymptomatic menopausal state: Secondary | ICD-10-CM | POA: Insufficient documentation

## 2020-11-23 ENCOUNTER — Inpatient Hospital Stay: Admission: RE | Admit: 2020-11-23 | Payer: Medicare Other | Source: Ambulatory Visit

## 2020-12-13 ENCOUNTER — Telehealth: Payer: Self-pay | Admitting: Oncology

## 2020-12-13 NOTE — Telephone Encounter (Signed)
Pt called in to see if she can have her labs done in [place] on 12-8. Please give her a call back at [phone]

## 2020-12-14 ENCOUNTER — Other Ambulatory Visit: Payer: Medicare Other

## 2020-12-15 ENCOUNTER — Inpatient Hospital Stay: Payer: Medicare Other

## 2020-12-15 ENCOUNTER — Ambulatory Visit: Payer: Medicare Other | Admitting: Oncology

## 2020-12-15 ENCOUNTER — Inpatient Hospital Stay: Payer: Medicare Other | Attending: Oncology

## 2020-12-15 ENCOUNTER — Other Ambulatory Visit: Payer: Self-pay

## 2020-12-15 ENCOUNTER — Ambulatory Visit: Payer: Medicare Other

## 2020-12-15 DIAGNOSIS — D509 Iron deficiency anemia, unspecified: Secondary | ICD-10-CM | POA: Insufficient documentation

## 2020-12-15 LAB — CBC WITH DIFFERENTIAL/PLATELET
Abs Immature Granulocytes: 0.04 10*3/uL (ref 0.00–0.07)
Basophils Absolute: 0.1 10*3/uL (ref 0.0–0.1)
Basophils Relative: 1 %
Eosinophils Absolute: 0.2 10*3/uL (ref 0.0–0.5)
Eosinophils Relative: 2 %
HCT: 38 % (ref 36.0–46.0)
Hemoglobin: 12 g/dL (ref 12.0–15.0)
Immature Granulocytes: 0 %
Lymphocytes Relative: 26 %
Lymphs Abs: 2.4 10*3/uL (ref 0.7–4.0)
MCH: 26.9 pg (ref 26.0–34.0)
MCHC: 31.6 g/dL (ref 30.0–36.0)
MCV: 85.2 fL (ref 80.0–100.0)
Monocytes Absolute: 0.9 10*3/uL (ref 0.1–1.0)
Monocytes Relative: 10 %
Neutro Abs: 5.7 10*3/uL (ref 1.7–7.7)
Neutrophils Relative %: 61 %
Platelets: 358 10*3/uL (ref 150–400)
RBC: 4.46 MIL/uL (ref 3.87–5.11)
RDW: 13.6 % (ref 11.5–15.5)
WBC: 9.4 10*3/uL (ref 4.0–10.5)
nRBC: 0 % (ref 0.0–0.2)

## 2020-12-15 LAB — IRON AND TIBC
Iron: 28 ug/dL (ref 28–170)
Saturation Ratios: 6 % — ABNORMAL LOW (ref 10.4–31.8)
TIBC: 483 ug/dL — ABNORMAL HIGH (ref 250–450)
UIBC: 455 ug/dL

## 2020-12-15 LAB — FERRITIN: Ferritin: 8 ng/mL — ABNORMAL LOW (ref 11–307)

## 2020-12-16 ENCOUNTER — Inpatient Hospital Stay: Payer: Medicare Other | Attending: Oncology | Admitting: Oncology

## 2020-12-16 ENCOUNTER — Encounter: Payer: Self-pay | Admitting: Oncology

## 2020-12-16 ENCOUNTER — Inpatient Hospital Stay: Payer: Medicare Other

## 2020-12-16 VITALS — BP 171/74 | HR 80 | Temp 98.1°F | Resp 18 | Wt 295.2 lb

## 2020-12-16 VITALS — BP 155/61 | HR 67 | Resp 16

## 2020-12-16 DIAGNOSIS — D508 Other iron deficiency anemias: Secondary | ICD-10-CM | POA: Diagnosis not present

## 2020-12-16 DIAGNOSIS — Z9049 Acquired absence of other specified parts of digestive tract: Secondary | ICD-10-CM

## 2020-12-16 MED ORDER — SODIUM CHLORIDE 0.9 % IV SOLN
Freq: Once | INTRAVENOUS | Status: AC
  Filled 2020-12-16: qty 250

## 2020-12-16 MED ORDER — SODIUM CHLORIDE 0.9 % IV SOLN
510.0000 mg | Freq: Once | INTRAVENOUS | Status: AC
  Administered 2020-12-16: 510 mg via INTRAVENOUS
  Filled 2020-12-16: qty 510

## 2020-12-16 NOTE — Progress Notes (Signed)
Pt here for follow up.

## 2020-12-16 NOTE — Progress Notes (Signed)
Patient declined 30 minutes post observation. Patient and VSS. Discharged home.

## 2020-12-17 ENCOUNTER — Encounter: Payer: Self-pay | Admitting: Oncology

## 2020-12-17 NOTE — Progress Notes (Signed)
Hematology oncology progress note   Clinic Day:  12/17/2020  Referring physician: Gayland Curry, MD  Chief Complaint: Donna Hart is a 65 y.o. female with Crohn's disease and iron deficiency anemia who is seen for 1 year assessment.   HPI: Patient presents for follow-up of iron deficiency anemia.Patient previously followed up by Dr.Corcoran, patient switched care to me on 12/17/20 Extensive medical record review was performed by me  Patient has history of Crohn's disease and recurrent iron deficiency anemia.  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  Pouchoscopy at [facility] on 01/22/2018 revealed anal stricture on digital rectal exam.  Prepouch ileum was normal.  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  There was inflammation at the pouch inlet secondary to pouchitis.  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 There were few shallow ulcers in the body of the pouch. There was a 4 cm segment of moderate inflammation in the pre-pouch ileum just above the pouch inlet (this looked worse than prior    She is on periodic IV Feraheme treatments for maintenance.  She tolerates Feraheme well without side effects.  Patient reports remote infusion reaction to a different brand of IV iron infusion at an outside facility. Today patient reports feeling well.  Denies any nausea vomiting diarrhea.  Denies hematochezia.    Past Medical History:  Diagnosis Date   Cancer ([place])    thyroid   Crohn's disease ([place])    IDA (iron deficiency anemia)    Multinodular goiter     Past Surgical History:  Procedure Laterality Date   CESAREAN SECTION      Family History  Problem Relation Age of Onset   Breast cancer Maternal Aunt 31  Her father was suspected to have an autoimmune disease. Her sister has ulcerative colitis.    Social History:  reports that she has never smoked.  She has never used smokeless tobacco. She reports that she does not currently use alcohol. She reports that she does not use drugs.  .  Allergies:  Allergies  Allergen Reactions   Levofloxacin Other (See Comments)    Achilles tendon pain   Ciprofloxacin Palpitations    Heart Palpitations   Morphine Rash    Current Medications: Current Outpatient Medications  Medication Sig Dispense Refill   amLODipine (NORVASC) 5 MG tablet Take 1 tablet by mouth daily.  11   BIOTIN PO Take 1 tablet by mouth daily.     Calcium Carbonate-Vitamin D 600-400 MG-UNIT tablet Take 1 tablet by mouth daily.      Celecoxib (CELEBREX PO) Take by mouth.     levothyroxine (SYNTHROID) 137 MCG tablet Take 137 mcg by mouth daily.     lisinopril (PRINIVIL,ZESTRIL) 10 MG tablet Take 10 mg by mouth daily.      Multiple Vitamins-Minerals (MULTIVITAMIN ADULT PO) Take 1 tablet by mouth daily.     omeprazole (PRILOSEC) 20 MG capsule Take 20 mg by mouth daily.     Ustekinumab (STELARA [place]) Inject 90 mg into the skin every 8 (eight) weeks.      No current facility-administered medications for this visit.   Review of Systems  Constitutional:  Negative for chills, diaphoresis, fever, malaise/fatigue and weight loss.       Feels "okay".  HENT: Negative.  Negative for congestion, ear discharge, ear pain, hearing loss, nosebleeds, sinus pain, sore throat and tinnitus.   Eyes: Negative.  Negative for blurred vision and double vision.  Respiratory:  Positive for shortness of breath. Negative for cough, hemoptysis and sputum production.   Cardiovascular: Negative.  Negative for chest pain, palpitations and leg swelling.  Gastrointestinal:  Negative for abdominal pain, blood in stool, constipation, diarrhea, heartburn, melena, nausea and vomiting.       Crohn's disease. "Pasty stool" s/p J-pouch surgery (2003).  Genitourinary: Negative.  Negative for dysuria, frequency, hematuria and urgency.  Musculoskeletal:  Positive for  myalgias (muscle cramps when she gets dehydrated). Negative for back pain, joint pain and neck pain.  Skin: Negative.  Negative for itching and rash.  Neurological: Negative.  Negative for dizziness, tingling, sensory change, focal weakness, weakness and headaches.  Endo/Heme/Allergies: Negative.  Does not bruise/bleed easily.  Psychiatric/Behavioral: Negative.  Negative for depression and memory loss. The patient is not nervous/anxious and does not have insomnia.   All other systems reviewed and are negative. Performance status (ECOG): 1  Vitals Blood pressure (!) 171/74, pulse 80, temperature 98.1 F (36.7 C), resp. rate 18, weight 295 lb 3.2 oz (133.9 kg).  Physical Exam Vitals and nursing note reviewed.  Constitutional:      General: She is not in acute distress.    Appearance: She is well-developed. She is not diaphoretic.  HENT:     Head: Normocephalic and atraumatic.     Mouth/Throat:     Pharynx: No oropharyngeal exudate.  Eyes:     General: No scleral icterus.    Conjunctiva/sclera: Conjunctivae normal.     Pupils: Pupils are equal, round, and reactive to light.  Cardiovascular:     Rate and Rhythm: Normal rate and regular rhythm.     Heart sounds: Normal heart sounds. No murmur heard.   No gallop.  Pulmonary:     Effort: Pulmonary effort is normal. No respiratory distress.     Breath sounds: Normal breath sounds. No wheezing or rales.  Chest:     Chest wall: No tenderness.  Abdominal:     General: Bowel sounds are normal. There is no distension.     Palpations: Abdomen is soft. There is no mass.     Tenderness: There is no abdominal tenderness. There is no guarding or rebound.  Musculoskeletal:        General: No tenderness. Normal range of motion.     Cervical back: Normal range of motion and neck supple.  Lymphadenopathy:     Head:     Right side of head: No preauricular, posterior auricular or occipital adenopathy.     Left side of head: No preauricular,  posterior auricular or occipital adenopathy.     Cervical: No cervical adenopathy.     Upper Body:     Right upper body: No supraclavicular adenopathy.     Left upper body: No supraclavicular adenopathy.     Lower Body: No right inguinal adenopathy. No left inguinal adenopathy.  Skin:    General: Skin is warm and dry.     Coloration: Skin is not pale.     Findings: No erythema or rash.  Neurological:     Mental Status: She is alert and oriented to person, place, and time.  Psychiatric:        Mood and Affect: Mood normal.   Appointment on 12/15/2020  Component Date Value Ref Range Status   WBC 12/15/2020 9.4  4.0 - 10.5 K/uL Final   RBC 12/15/2020 4.46  3.87 - 5.11 MIL/uL Final   Hemoglobin 12/15/2020 12.0  12.0 - 15.0 g/dL Final   HCT 12/15/2020 38.0  36.0 - 46.0 % Final   MCV 12/15/2020 85.2  80.0 - 100.0 fL Final   MCH 12/15/2020 26.9  26.0 - 34.0 pg Final   MCHC 12/15/2020 31.6  30.0 - 36.0 g/dL Final   RDW 12/15/2020 13.6  11.5 - 15.5 % Final   Platelets 12/15/2020 358  150 - 400 K/uL Final   nRBC 12/15/2020 0.0  0.0 - 0.2 % Final   Neutrophils Relative % 12/15/2020 61  % Final   Neutro Abs 12/15/2020 5.7  1.7 - 7.7 K/uL Final   Lymphocytes Relative 12/15/2020 26  % Final   Lymphs Abs 12/15/2020 2.4  0.7 - 4.0 K/uL Final   Monocytes Relative 12/15/2020 10  % Final   Monocytes Absolute 12/15/2020 0.9  0.1 - 1.0 K/uL Final   Eosinophils Relative 12/15/2020 2  % Final   Eosinophils Absolute 12/15/2020 0.2  0.0 - 0.5 K/uL Final   Basophils Relative 12/15/2020 1  % Final   Basophils Absolute 12/15/2020 0.1  0.0 - 0.1 K/uL Final   Immature Granulocytes 12/15/2020 0  % Final   Abs Immature Granulocytes 12/15/2020 0.04  0.00 - 0.07 K/uL Final   Performed at [facility], [address], [place], [place] [address]   Ferritin 12/15/2020 8 (L)  11 - 307 ng/mL Final   Performed at [facility], [address] [place], [place] [address]   Iron 12/15/2020 28   28 - 170 ug/dL Final   TIBC 12/15/2020 483 (H)  250 - 450 ug/dL Final   Saturation Ratios 12/15/2020 6 (L)  10.4 - 31.8 % Final   UIBC 12/15/2020 455  ug/dL Final   Performed at [facility], [address] [place],  [address]    Assessment and assessment, 1. Other iron deficiency anemia   2. History of total colectomy     #Iron deficiency anemia, in the context of Crohn's disease, total colectomy with malabsorption. Labs reviewed and discussed with patient.  Hemoglobin remained stable and normal. Iron panel is consistent with iron deficiency.  Recommend patient to proceed maintenance IV Feraheme x1 today.  #History of colectomy, at risk of developing vitamin deficiency.  In addition to checking iron panels, will check B12 folate at the next visit. . 6.   RTC in 6 months lab NP +/- ferritin, follow-up in 12 months  labs (CBC with diff, ferritin, iron studies- day before), and +/- Feraheme.  I discussed the assessment and treatment plan with the patient.  The patient was provided an opportunity to ask questions and all were answered.  The patient agreed with the plan and demonstrated an understanding of the instructions.  The patient was advised to call back if the symptoms worsen or if the condition fails to improve as anticipated.  Earlie Server, MD, PhD [facility] Hematology Oncology 12/17/2020

## 2021-01-25 ENCOUNTER — Other Ambulatory Visit: Payer: Self-pay

## 2021-01-25 ENCOUNTER — Ambulatory Visit
Admission: RE | Admit: 2021-01-25 | Discharge: 2021-01-25 | Disposition: A | Discharge status: Home / Self Care | Payer: Medicare Other | Source: Ambulatory Visit | Attending: Family Medicine | Admitting: Family Medicine

## 2021-01-25 DIAGNOSIS — Z780 Asymptomatic menopausal state: Secondary | ICD-10-CM | POA: Diagnosis present

## 2021-05-04 ENCOUNTER — Telehealth: Payer: Self-pay | Admitting: *Deleted

## 2021-05-04 NOTE — Telephone Encounter (Signed)
Pt left message stating she has not been feeling well.  More tired and increased fatigue.  Pt went to PCP and Hgb 8.6 and Ferritin 4. Pt does not have appointment until June 15, 2021 for labs, Beckey Rutter and infusion.  Pt is requesting to be seen earlier, "ASAP".  ?

## 2021-05-04 NOTE — Telephone Encounter (Signed)
Please r/s appt with Lauren to be sooner and inform pt of appt details. Thanks.  ?

## 2021-05-04 NOTE — Telephone Encounter (Signed)
Chart opened in error

## 2021-05-04 NOTE — Telephone Encounter (Signed)
Please cancel lab on 6/8 and move it up to be 1-2 days prior to visit with NP. Thanks  ?

## 2021-05-04 NOTE — Telephone Encounter (Signed)
RN left message that I have informed Dr Tasia Catchings and team as well as Beckey Rutter, NP and her team of lab values and need for sooner appointment.  Stated someone would call as soon as possible with appointment information.  ?

## 2021-05-12 ENCOUNTER — Encounter: Payer: Self-pay | Admitting: Nurse Practitioner

## 2021-05-12 ENCOUNTER — Inpatient Hospital Stay: Payer: Medicare Other | Attending: Nurse Practitioner | Admitting: Nurse Practitioner

## 2021-05-12 ENCOUNTER — Other Ambulatory Visit: Payer: Medicare Other

## 2021-05-12 ENCOUNTER — Inpatient Hospital Stay: Payer: Medicare Other

## 2021-05-12 VITALS — BP 153/64 | HR 77 | Temp 98.2°F | Resp 18 | Wt 298.2 lb

## 2021-05-12 VITALS — BP 143/53 | HR 67

## 2021-05-12 DIAGNOSIS — D508 Other iron deficiency anemias: Secondary | ICD-10-CM

## 2021-05-12 DIAGNOSIS — D509 Iron deficiency anemia, unspecified: Secondary | ICD-10-CM | POA: Insufficient documentation

## 2021-05-12 MED ORDER — SODIUM CHLORIDE 0.9 % IV SOLN
510.0000 mg | Freq: Once | INTRAVENOUS | Status: AC
  Administered 2021-05-12: 510 mg via INTRAVENOUS
  Filled 2021-05-12: qty 510

## 2021-05-12 MED ORDER — SODIUM CHLORIDE 0.9 % IV SOLN
Freq: Once | INTRAVENOUS | Status: AC
  Filled 2021-05-12: qty 250

## 2021-05-12 NOTE — Progress Notes (Signed)
Patient here today for follow up regarding anemia. Patient reports dizziness at times, shortness of breath.  ?

## 2021-05-12 NOTE — Progress Notes (Signed)
?Hematology Oncology Progress Note ?[place] at [place], [place] ?[phone] ? ?Clinic Day:  05/12/2021 ? ?Referring physician: Gayland Curry, MD ? ?Chief Complaint: Donna Hart is a 66 y.o. female with Crohn's disease and iron deficiency anemia who is seen for 1 year assessment.  ? ?HPI: Patient presents for follow-up of iron deficiency anemia.Patient previously followed up by Dr.Corcoran, patient switched care to me on 12/17/20 ?Extensive medical record review was performed by me ? ?Patient has history of Crohn's disease and recurrent iron deficiency anemia.  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 ? ?Pouchoscopy at [facility] on 01/22/2018 revealed anal stricture on digital rectal exam.  Prepouch ileum was normal.  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  There was inflammation at the pouch inlet secondary to pouchitis.  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 There were few shallow ulcers in the body of the pouch. There was a 4 cm segment of moderate inflammation in the pre-pouch ileum just above the pouch inlet (this looked worse than prior ? ?  She is on periodic IV Feraheme treatments for maintenance.  She tolerates Feraheme well without side effects.  Patient reports remote infusion reaction to a different brand of IV iron infusion at an outside facility. ?Today patient reports feeling well.  Denies any nausea vomiting diarrhea.  Denies hematochezia. ? ?Interval History: Patient is 63 female with above history, proctoscopy, iron deficiency anemia who returns to laboratory evaluation and follow-up. She complains of increased fatigue and shortness of breath with exertion. Has been craving ice. Had iron studies with pcp which showed anemia and low ferritin.  ? ?Past Medical History:  ?Diagnosis Date  ? Cancer [facility])   ? thyroid  ? Crohn's disease  ([place])   ? IDA (iron deficiency anemia)   ? Multinodular goiter   ? ?Past Surgical History:  ?Procedure Laterality Date  ? CESAREAN SECTION    ? ?Family History  ?Problem Relation Age of Onset  ? Breast cancer Maternal Aunt 42  ?Her father was suspected to have an autoimmune disease. Her sister has ulcerative colitis.  ? ?Social History:  reports that she has never smoked. She has never used smokeless tobacco. She reports that she does not currently use alcohol. She reports that she does not use drugs.  . ? ?Allergies:  ?Allergies  ?Allergen Reactions  ? Levofloxacin Other (See Comments)  ?  Achilles tendon pain  ? Ciprofloxacin Palpitations  ?  Heart Palpitations  ? Morphine Rash  ? ?Current Medications: ?Current Outpatient Medications  ?Medication Sig Dispense Refill  ? amLODipine (NORVASC) 5 MG tablet Take 1 tablet by mouth daily.  11  ? BIOTIN PO Take 1 tablet by mouth daily.    ? Calcium Carbonate-Vitamin D 600-400 MG-UNIT tablet Take 1 tablet by mouth daily.     ? Celecoxib (CELEBREX PO) Take by mouth.    ? levothyroxine (SYNTHROID) 137 MCG tablet Take 137 mcg by mouth daily.    ? Multiple Vitamins-Minerals (MULTIVITAMIN ADULT PO) Take 1 tablet by mouth daily.    ? omeprazole (PRILOSEC) 20 MG capsule Take 20 mg by mouth daily.    ? lisinopril (PRINIVIL,ZESTRIL) 10 MG tablet Take 10 mg by mouth daily.     ? Ustekinumab (STELARA [place]) Inject 90 mg into the skin every 8 (eight) weeks.  (Patient not taking: Reported on 05/12/2021)    ? ?No current facility-administered medications for this visit.  ? ?  Review of Systems  ?Constitutional:  Positive for malaise/fatigue. Negative for chills, fever and weight loss.  ?HENT:  Negative for hearing loss, nosebleeds, sore throat and tinnitus.   ?Eyes:  Negative for blurred vision and double vision.  ?Respiratory:  Positive for shortness of breath. Negative for cough, hemoptysis and wheezing.   ?Cardiovascular:  Negative for chest pain, palpitations and leg swelling.   ?Gastrointestinal:  Negative for abdominal pain, blood in stool, constipation, diarrhea, melena, nausea and vomiting.  ?Genitourinary:  Negative for dysuria and urgency.  ?Musculoskeletal:  Negative for back pain, falls, joint pain and myalgias.  ?Skin:  Negative for itching and rash.  ?Neurological:  Negative for dizziness, tingling, sensory change, loss of consciousness, weakness and headaches.  ?Endo/Heme/Allergies:  Negative for environmental allergies. Does not bruise/bleed easily.  ?Psychiatric/Behavioral:  Negative for depression. The patient is not nervous/anxious and does not have insomnia.   ? ?Performance status (ECOG): 1 ? ?Vitals ?Blood pressure (!) 153/64, pulse 77, temperature 98.2 ?F (36.8 ?C), temperature source Tympanic, resp. rate 18, weight 298 lb 3.2 oz (135.3 kg), SpO2 100 %. ? ?Physical Exam ?Constitutional:   ?   Appearance: She is not ill-appearing.  ?Cardiovascular:  ?   Rate and Rhythm: Normal rate and regular rhythm.  ?Pulmonary:  ?   Effort: No respiratory distress.  ?Abdominal:  ?   General: There is no distension.  ?   Tenderness: There is no abdominal tenderness.  ?Neurological:  ?   Mental Status: She is alert and oriented to person, place, and time.  ?Psychiatric:     ?   Mood and Affect: Mood normal.     ?   Behavior: Behavior normal.  ? ?No visits with results within 3 Day(s) from this visit.  ?Latest known visit with results is:  ?Appointment on 12/15/2020  ?Component Date Value Ref Range Status  ? WBC 12/15/2020 9.4  4.0 - 10.5 K/uL Final  ? RBC 12/15/2020 4.46  3.87 - 5.11 MIL/uL Final  ? Hemoglobin 12/15/2020 12.0  12.0 - 15.0 g/dL Final  ? HCT 12/15/2020 38.0  36.0 - 46.0 % Final  ? MCV 12/15/2020 85.2  80.0 - 100.0 fL Final  ? MCH 12/15/2020 26.9  26.0 - 34.0 pg Final  ? MCHC 12/15/2020 31.6  30.0 - 36.0 g/dL Final  ? RDW 12/15/2020 13.6  11.5 - 15.5 % Final  ? Platelets 12/15/2020 358  150 - 400 K/uL Final  ? nRBC 12/15/2020 0.0  0.0 - 0.2 % Final  ? Neutrophils Relative %  12/15/2020 61  % Final  ? Neutro Abs 12/15/2020 5.7  1.7 - 7.7 K/uL Final  ? Lymphocytes Relative 12/15/2020 26  % Final  ? Lymphs Abs 12/15/2020 2.4  0.7 - 4.0 K/uL Final  ? Monocytes Relative 12/15/2020 10  % Final  ? Monocytes Absolute 12/15/2020 0.9  0.1 - 1.0 K/uL Final  ? Eosinophils Relative 12/15/2020 2  % Final  ? Eosinophils Absolute 12/15/2020 0.2  0.0 - 0.5 K/uL Final  ? Basophils Relative 12/15/2020 1  % Final  ? Basophils Absolute 12/15/2020 0.1  0.0 - 0.1 K/uL Final  ? Immature Granulocytes 12/15/2020 0  % Final  ? Abs Immature Granulocytes 12/15/2020 0.04  0.00 - 0.07 K/uL Final  ? Performed at [facility], [address], [place], [place] [address]  ? Ferritin 12/15/2020 8 (L)  11 - 307 ng/mL Final  ? Performed at [facility], [address] [place], [place] [address]  ?  Iron 12/15/2020 28  28 - 170 ug/dL Final  ? TIBC 12/15/2020 483 (H)  250 - 450 ug/dL Final  ? Saturation Ratios 12/15/2020 6 (L)  10.4 - 31.8 % Final  ? UIBC 12/15/2020 455  ug/dL Final  ? Performed at [facility], [address], [place], [place] [address]  ? ? ?Assessment and assessment, ?No diagnosis found. ?  ?# Iron deficiency anemia- in the context of Crohn's disease, total colectomy with malabsorption. ?Labs reviewed and discussed with patient. Hemoglobin dropped to 8.6. Ferritin 4. Iron sat 2%. Proceed with feraheme x 2.  ? ?# History of colectomy- at risk of developing vitamin deficiency d/t surgery. B12 was normal. Vitamin D was low. She will start oral vitamin d.  ? ?Feraheme today ?Feraheme next week ?6 weeks labs (cbc, ferritin, iron studies) ?Day later Dr Tasia Catchings, +/- feraheme- la ? ?I discussed the assessment and treatment plan with the patient.  The patient was provided an opportunity to ask questions and all were answered.  The patient agreed with the plan and demonstrated an understanding of the instructions.  The patient was advised to call back if the symptoms worsen or if the  condition fails to improve as anticipated. ? ?Thank you for allowing me to participate in the care of this very pleasant patient.  ? ?Beckey Rutter, DNP, AGNP-C ?[place] at [facility] ?[phone] (clini

## 2021-05-19 ENCOUNTER — Inpatient Hospital Stay: Payer: Medicare Other

## 2021-05-19 ENCOUNTER — Other Ambulatory Visit: Payer: Self-pay | Admitting: Nurse Practitioner

## 2021-05-19 VITALS — BP 140/83 | HR 72 | Temp 98.8°F | Resp 19

## 2021-05-19 DIAGNOSIS — D509 Iron deficiency anemia, unspecified: Secondary | ICD-10-CM | POA: Diagnosis not present

## 2021-05-19 DIAGNOSIS — D508 Other iron deficiency anemias: Secondary | ICD-10-CM

## 2021-05-19 MED ORDER — SODIUM CHLORIDE 0.9 % IV SOLN
510.0000 mg | Freq: Once | INTRAVENOUS | Status: AC
  Administered 2021-05-19: 510 mg via INTRAVENOUS
  Filled 2021-05-19: qty 510

## 2021-05-19 MED ORDER — SODIUM CHLORIDE 0.9 % IV SOLN
Freq: Once | INTRAVENOUS | Status: AC
  Filled 2021-05-19: qty 250

## 2021-05-19 NOTE — Patient Instructions (Signed)

## 2021-06-15 ENCOUNTER — Other Ambulatory Visit: Payer: Medicare Other

## 2021-06-16 ENCOUNTER — Other Ambulatory Visit: Payer: Medicare Other

## 2021-06-16 ENCOUNTER — Ambulatory Visit: Payer: Medicare Other | Admitting: Nurse Practitioner

## 2021-06-16 ENCOUNTER — Ambulatory Visit: Payer: Medicare Other

## 2021-06-19 ENCOUNTER — Inpatient Hospital Stay: Payer: Medicare Other | Attending: Nurse Practitioner

## 2021-06-19 ENCOUNTER — Other Ambulatory Visit: Payer: Self-pay

## 2021-06-19 DIAGNOSIS — D509 Iron deficiency anemia, unspecified: Secondary | ICD-10-CM | POA: Diagnosis present

## 2021-06-19 DIAGNOSIS — D508 Other iron deficiency anemias: Secondary | ICD-10-CM

## 2021-06-19 DIAGNOSIS — Z79899 Other long term (current) drug therapy: Secondary | ICD-10-CM | POA: Insufficient documentation

## 2021-06-19 DIAGNOSIS — Z9049 Acquired absence of other specified parts of digestive tract: Secondary | ICD-10-CM

## 2021-06-19 LAB — CBC WITH DIFFERENTIAL/PLATELET
Abs Immature Granulocytes: 0.03 10*3/uL (ref 0.00–0.07)
Basophils Absolute: 0 10*3/uL (ref 0.0–0.1)
Basophils Relative: 0 %
Eosinophils Absolute: 0.2 10*3/uL (ref 0.0–0.5)
Eosinophils Relative: 2 %
HCT: 36.9 % (ref 36.0–46.0)
Hemoglobin: 11.3 g/dL — ABNORMAL LOW (ref 12.0–15.0)
Immature Granulocytes: 0 %
Lymphocytes Relative: 24 %
Lymphs Abs: 2.1 10*3/uL (ref 0.7–4.0)
MCH: 25.7 pg — ABNORMAL LOW (ref 26.0–34.0)
MCHC: 30.6 g/dL (ref 30.0–36.0)
MCV: 83.9 fL (ref 80.0–100.0)
Monocytes Absolute: 1 10*3/uL (ref 0.1–1.0)
Monocytes Relative: 11 %
Neutro Abs: 5.6 10*3/uL (ref 1.7–7.7)
Neutrophils Relative %: 63 %
Platelets: 335 10*3/uL (ref 150–400)
RBC: 4.4 MIL/uL (ref 3.87–5.11)
RDW: 22.3 % — ABNORMAL HIGH (ref 11.5–15.5)
WBC: 8.9 10*3/uL (ref 4.0–10.5)
nRBC: 0 % (ref 0.0–0.2)

## 2021-06-19 LAB — IRON AND TIBC
Iron: 28 ug/dL (ref 28–170)
Saturation Ratios: 7 % — ABNORMAL LOW (ref 10.4–31.8)
TIBC: 414 ug/dL (ref 250–450)
UIBC: 386 ug/dL

## 2021-06-19 LAB — VITAMIN B12: Vitamin B-12: 376 pg/mL (ref 180–914)

## 2021-06-19 LAB — FERRITIN: Ferritin: 22 ng/mL (ref 11–307)

## 2021-06-19 LAB — FOLATE: Folate: 35 ng/mL (ref >5.9)

## 2021-06-20 ENCOUNTER — Inpatient Hospital Stay: Payer: Medicare Other

## 2021-06-20 ENCOUNTER — Encounter: Payer: Self-pay | Admitting: Oncology

## 2021-06-20 ENCOUNTER — Inpatient Hospital Stay: Payer: Medicare Other | Admitting: Oncology

## 2021-06-20 VITALS — BP 151/60 | HR 77 | Temp 97.1°F | Wt 299.0 lb

## 2021-06-20 VITALS — BP 123/53 | HR 67 | Resp 20

## 2021-06-20 DIAGNOSIS — Z9049 Acquired absence of other specified parts of digestive tract: Secondary | ICD-10-CM

## 2021-06-20 DIAGNOSIS — D508 Other iron deficiency anemias: Secondary | ICD-10-CM

## 2021-06-20 DIAGNOSIS — D509 Iron deficiency anemia, unspecified: Secondary | ICD-10-CM | POA: Diagnosis not present

## 2021-06-20 DIAGNOSIS — K50013 Crohn's disease of small intestine with fistula: Secondary | ICD-10-CM

## 2021-06-20 MED ORDER — SODIUM CHLORIDE 0.9 % IV SOLN
Freq: Once | INTRAVENOUS | Status: AC
  Filled 2021-06-20: qty 250

## 2021-06-20 MED ORDER — SODIUM CHLORIDE 0.9 % IV SOLN
510.0000 mg | Freq: Once | INTRAVENOUS | Status: AC
  Administered 2021-06-20: 510 mg via INTRAVENOUS
  Filled 2021-06-20: qty 17

## 2021-06-20 NOTE — Assessment & Plan Note (Signed)
in the context of Crohn's disease, total colectomy with malabsorption. Labs reviewed and discussed with patient.  Hemoglobin is decreased  Iron panel is consistent with iron deficient Recommend IV Feraheme weekly x2

## 2021-06-20 NOTE — Progress Notes (Signed)
Hematology/Oncology Progress note [phone] [phone]      Clinic Day:  06/20/2021  ASSESSMENT & PLAN:  Iron deficiency anemia in the context of Crohn's disease, total colectomy with malabsorption. Labs reviewed and discussed with patient.  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  This could be secondary to to chronic blood loss, inflammatory bowel disease flareup due to being off Stelara.  Encourage patient to follow-up with gastroenterology.  History of colectomy Patient is at risk of vitamin deficiency. Vitamin B12 level is within normal limit, decreased. She has stopped vitamin B12 since April 2023.  Recommend patient to resume vitamin B12 1000 mcg daily 3 times per week.  Orders Placed This Encounter  Procedures   CBC with Differential/Platelet    Standing Status:   Future    Standing Expiration Date:   06/21/2022   Ferritin    Standing Status:   Future    Standing Expiration Date:   06/21/2022   Iron and TIBC    Standing Status:   Future    Standing Expiration Date:   06/21/2022   I discussed the assessment and treatment plan with the patient.  The patient was provided an opportunity to ask questions and all were answered.  The patient agreed with the plan and demonstrated an understanding of the instructions.  The patient was advised to call back if the symptoms worsen or if the condition fails to improve as anticipated.  Follow-up plan IV Feraheme today.  IV Feraheme 1 week flex.  2 months Lab encounter - iron labs.  4 months lab prior MD + Feraheme. Iron labs   Earlie Server, MD, PhD [facility] Hematology Oncology 06/20/2021    Chief Complaint: Donna Hart is a 66 y.o. female with Crohn's disease and iron deficiency anemia presents for follow-up. HPI: Patient  presents for follow-up of iron deficiency anemia.Patient previously followed up by Dr.Corcoran, patient switched care to me on 12/17/20 Extensive medical record review was performed by me  Patient has history of Crohn's disease and recurrent iron deficiency anemia.  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  Pouchoscopy at [facility] on 01/22/2018 revealed anal stricture on digital rectal exam.  Prepouch ileum was normal.  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  There was inflammation at the pouch inlet secondary to pouchitis.  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 There were few shallow ulcers in the body of the pouch. There was a 4 cm segment of moderate inflammation in the pre-pouch ileum just above the pouch inlet (this looked worse than prior    She is on periodic IV Feraheme treatments for maintenance.  She tolerates Feraheme well without side effects.  Patient reports remote infusion reaction to a different brand of IV iron infusion at an outside facility. Today patient reports feeling well.  Denies any nausea vomiting diarrhea.  Denies hematochezia.  INTERVAL HISTORY Donna Hart is a 66 y.o. female who has above history reviewed by me today presents for follow up visit for management of iron deficiency anemia Patient was seen by nurse practitioner Beckey Rutter in May 2023 due to severe fatigue.  Lab work-up is consistent with iron deficiency anemia and patient received IV Feraheme weekly x2.  Fatigue has improved. Today she presents for follow-up.+ Fatigue. +  Crohn's disease, due to insurance coverage/co-pay, patient has been off Stelara for the past 3 months.  She has a J-pouch, not able to tell whether she has blood in the stool or not.   Past Medical History:  Diagnosis Date   Cancer ([place])    thyroid   Crohn's disease ([place])    IDA (iron deficiency anemia)     Multinodular goiter     Past Surgical History:  Procedure Laterality Date   CESAREAN SECTION      Family History  Problem Relation Age of Onset   Breast cancer Maternal Aunt 30  Her father was suspected to have an autoimmune disease. Her sister has ulcerative colitis.    Social History:  reports that she has never smoked. She has never used smokeless tobacco. She reports that she does not currently use alcohol. She reports that she does not use drugs.  .  Allergies:  Allergies  Allergen Reactions   Levofloxacin Other (See Comments)    Achilles tendon pain   Ciprofloxacin Palpitations    Heart Palpitations   Morphine Rash    Current Medications: Current Outpatient Medications  Medication Sig Dispense Refill   amLODipine (NORVASC) 5 MG tablet Take 1 tablet by mouth daily.  11   BIOTIN PO Take 1 tablet by mouth daily.     Calcium Carbonate-Vitamin D 600-400 MG-UNIT tablet Take 1 tablet by mouth daily.      Celecoxib (CELEBREX PO) Take by mouth.     levothyroxine (SYNTHROID) 137 MCG tablet Take 137 mcg by mouth daily.     Multiple Vitamins-Minerals (MULTIVITAMIN ADULT PO) Take 1 tablet by mouth daily.     omeprazole (PRILOSEC) 20 MG capsule Take 20 mg by mouth daily.     lisinopril (PRINIVIL,ZESTRIL) 10 MG tablet Take 10 mg by mouth daily.      Ustekinumab (STELARA [place]) Inject 90 mg into the skin every 8 (eight) weeks.  (Patient not taking: Reported on 05/12/2021)     No current facility-administered medications for this visit.   Review of Systems  Constitutional:  Positive for fatigue. Negative for appetite change, chills and fever.  HENT:   Negative for hearing loss and voice change.   Eyes:  Negative for eye problems.  Respiratory:  Negative for chest tightness and cough.   Cardiovascular:  Negative for chest pain.  Gastrointestinal:  Negative for abdominal distention, abdominal pain and blood in stool.       Status post J-pouch surgery in 2003  Endocrine: Negative for  hot flashes.  Genitourinary:  Negative for difficulty urinating and frequency.   Musculoskeletal:  Positive for arthralgias.  Skin:  Negative for itching and rash.  Neurological:  Negative for extremity weakness.  Hematological:  Negative for adenopathy.  Psychiatric/Behavioral:  Negative for confusion.     Performance status (ECOG): 1  Vitals Blood pressure (!) 151/60, pulse 77, temperature (!) 97.1 F (36.2 C), temperature source Tympanic, weight 299 lb (135.6 kg), SpO2 99 %.  Physical Exam Constitutional:      General: She is not in acute distress.    Appearance: She is obese. She is not diaphoretic.  HENT:     Head: Normocephalic and atraumatic.     Nose: Nose normal.     Mouth/Throat:     Pharynx: No oropharyngeal exudate.  Eyes:     General: No scleral icterus.    Pupils: Pupils are equal, round, and reactive to light.  Cardiovascular:     Rate and Rhythm: Normal rate  and regular rhythm.     Heart sounds: No murmur heard. Pulmonary:     Effort: Pulmonary effort is normal. No respiratory distress.     Breath sounds: No rales.  Chest:     Chest wall: No tenderness.  Abdominal:     Palpations: Abdomen is soft.     Comments: J-pouch  Musculoskeletal:        General: Normal range of motion.     Cervical back: Normal range of motion and neck supple.  Skin:    General: Skin is warm and dry.     Findings: No erythema.  Neurological:     Mental Status: She is alert and oriented to person, place, and time.     Cranial Nerves: No cranial nerve deficit.     Motor: No abnormal muscle tone.     Coordination: Coordination normal.  Psychiatric:        Mood and Affect: Mood and affect normal.     Orders Only on 06/19/2021  Component Date Value Ref Range Status   WBC 06/19/2021 8.9  4.0 - 10.5 K/uL Final   RBC 06/19/2021 4.40  3.87 - 5.11 MIL/uL Final   Hemoglobin 06/19/2021 11.3 (L)  12.0 - 15.0 g/dL Final   HCT 06/19/2021 36.9  36.0 - 46.0 % Final   MCV 06/19/2021  83.9  80.0 - 100.0 fL Final   MCH 06/19/2021 25.7 (L)  26.0 - 34.0 pg Final   MCHC 06/19/2021 30.6  30.0 - 36.0 g/dL Final   RDW 06/19/2021 22.3 (H)  11.5 - 15.5 % Final   Platelets 06/19/2021 335  150 - 400 K/uL Final   nRBC 06/19/2021 0.0  0.0 - 0.2 % Final   Neutrophils Relative % 06/19/2021 63  % Final   Neutro Abs 06/19/2021 5.6  1.7 - 7.7 K/uL Final   Lymphocytes Relative 06/19/2021 24  % Final   Lymphs Abs 06/19/2021 2.1  0.7 - 4.0 K/uL Final   Monocytes Relative 06/19/2021 11  % Final   Monocytes Absolute 06/19/2021 1.0  0.1 - 1.0 K/uL Final   Eosinophils Relative 06/19/2021 2  % Final   Eosinophils Absolute 06/19/2021 0.2  0.0 - 0.5 K/uL Final   Basophils Relative 06/19/2021 0  % Final   Basophils Absolute 06/19/2021 0.0  0.0 - 0.1 K/uL Final   Immature Granulocytes 06/19/2021 0  % Final   Abs Immature Granulocytes 06/19/2021 0.03  0.00 - 0.07 K/uL Final   Performed at [facility], [address] [place], [place] [address]   Iron 06/19/2021 28  28 - 170 ug/dL Final   TIBC 06/19/2021 414  250 - 450 ug/dL Final   Saturation Ratios 06/19/2021 7 (L)  10.4 - 31.8 % Final   UIBC 06/19/2021 386  ug/dL Final   Performed at [facility], [address] [place], [place] [address]   Ferritin 06/19/2021 22  11 - 307 ng/mL Final   Performed at [facility], [address] [place], [place] [address]   Vitamin B-12 06/19/2021 376  180 - 914 pg/mL Final   Comment: (NOTE) This assay is not validated for testing neonatal or myeloproliferative syndrome specimens for Vitamin B12 levels. Performed at [facility], [place] [address], [place], [place] [address]    Folate 06/19/2021 35.0  >5.9 ng/mL Final   Performed at [facility], [address] [place], [place] [address]

## 2021-06-20 NOTE — Assessment & Plan Note (Signed)
Persistent iron deficiency despite recent IV iron treatments.  This could be secondary to to chronic blood loss, inflammatory bowel disease flareup due to being off Stelara.  Encourage patient to follow-up with gastroenterology.

## 2021-06-20 NOTE — Assessment & Plan Note (Signed)
Patient is at risk of vitamin deficiency. Vitamin B12 level is within normal limit, decreased. She has stopped vitamin B12 since April 2023.  Recommend patient to resume vitamin B12 1000 mcg daily 3 times per week.

## 2021-06-20 NOTE — Patient Instructions (Signed)

## 2021-06-29 MED FILL — Ferumoxytol Inj 510 MG/17ML (30 MG/ML) (Elemental Fe): INTRAVENOUS | Qty: 17 | Status: AC

## 2021-06-30 ENCOUNTER — Inpatient Hospital Stay: Payer: Medicare Other

## 2021-08-21 ENCOUNTER — Inpatient Hospital Stay: Payer: Medicare Other | Attending: Nurse Practitioner

## 2021-08-21 DIAGNOSIS — D509 Iron deficiency anemia, unspecified: Secondary | ICD-10-CM | POA: Insufficient documentation

## 2021-08-21 DIAGNOSIS — D508 Other iron deficiency anemias: Secondary | ICD-10-CM

## 2021-08-21 LAB — FERRITIN: Ferritin: 12 ng/mL (ref 11–307)

## 2021-08-21 LAB — CBC WITH DIFFERENTIAL/PLATELET
Abs Immature Granulocytes: 0.04 10*3/uL (ref 0.00–0.07)
Basophils Absolute: 0.1 10*3/uL (ref 0.0–0.1)
Basophils Relative: 1 %
Eosinophils Absolute: 0.2 10*3/uL (ref 0.0–0.5)
Eosinophils Relative: 2 %
HCT: 39 % (ref 36.0–46.0)
Hemoglobin: 12.1 g/dL (ref 12.0–15.0)
Immature Granulocytes: 0 %
Lymphocytes Relative: 24 %
Lymphs Abs: 2.2 10*3/uL (ref 0.7–4.0)
MCH: 26.5 pg (ref 26.0–34.0)
MCHC: 31 g/dL (ref 30.0–36.0)
MCV: 85.5 fL (ref 80.0–100.0)
Monocytes Absolute: 0.8 10*3/uL (ref 0.1–1.0)
Monocytes Relative: 9 %
Neutro Abs: 5.7 10*3/uL (ref 1.7–7.7)
Neutrophils Relative %: 64 %
Platelets: 371 10*3/uL (ref 150–400)
RBC: 4.56 MIL/uL (ref 3.87–5.11)
RDW: 16.5 % — ABNORMAL HIGH (ref 11.5–15.5)
WBC: 9 10*3/uL (ref 4.0–10.5)
nRBC: 0 % (ref 0.0–0.2)

## 2021-08-21 LAB — IRON AND TIBC
Iron: 31 ug/dL (ref 28–170)
Saturation Ratios: 7 % — ABNORMAL LOW (ref 10.4–31.8)
TIBC: 430 ug/dL (ref 250–450)
UIBC: 399 ug/dL

## 2021-08-22 ENCOUNTER — Telehealth: Payer: Self-pay | Admitting: Oncology

## 2021-08-22 ENCOUNTER — Telehealth: Payer: Self-pay

## 2021-08-22 NOTE — Telephone Encounter (Signed)
-----   Message from Earlie Server, MD sent at 08/21/2021 11:05 PM EDT ----- Please let patient know that her hemoglobin has improved.  Iron saturation is still persistently low.  Recommend 1 additional dose of IV Feraheme x1.  Please arrange.  Keep current follow-up appointment

## 2021-08-22 NOTE — Telephone Encounter (Signed)
Called and left detailed message informing patient of lab results and Dr. Collie Siad recommendation. Advised to keep current f/u. Advised our scheduler will get this schedule and notify her of appt.  Please schedule patient for IV Fereheme x1. Please notify patient of appt. Thanks

## 2021-08-22 NOTE — Telephone Encounter (Signed)
Left VM with patient to schedule feraheme x 1. Requested she call back when able.

## 2021-08-24 ENCOUNTER — Encounter: Payer: Self-pay | Admitting: Oncology

## 2021-08-24 NOTE — Telephone Encounter (Signed)
Please advise 

## 2021-08-29 ENCOUNTER — Ambulatory Visit: Payer: Medicare Other

## 2021-08-30 ENCOUNTER — Encounter: Payer: Self-pay | Admitting: Oncology

## 2021-09-01 ENCOUNTER — Inpatient Hospital Stay: Payer: Medicare Other

## 2021-09-01 VITALS — BP 184/67 | HR 95 | Temp 97.3°F | Resp 18

## 2021-09-01 DIAGNOSIS — D508 Other iron deficiency anemias: Secondary | ICD-10-CM

## 2021-09-01 DIAGNOSIS — D509 Iron deficiency anemia, unspecified: Secondary | ICD-10-CM | POA: Diagnosis not present

## 2021-09-01 MED ORDER — SODIUM CHLORIDE 0.9 % IV SOLN
510.0000 mg | Freq: Once | INTRAVENOUS | Status: AC
  Administered 2021-09-01: 510 mg via INTRAVENOUS
  Filled 2021-09-01: qty 510

## 2021-09-01 MED ORDER — SODIUM CHLORIDE 0.9 % IV SOLN
INTRAVENOUS | Status: DC
  Filled 2021-09-01: qty 250

## 2021-09-01 NOTE — Patient Instructions (Signed)
[facility] AT [place]  Discharge Instructions: Thank you for choosing [facility] to provide your oncology and hematology care.  If you have a lab appointment with the [facility], please go directly to the [facility] and check in at the registration area.  Wear comfortable clothing and clothing appropriate for easy access to any Portacath or PICC line.   We strive to give you quality time with your provider. You may need to reschedule your appointment if you arrive late (15 or more minutes).  Arriving late affects you and other patients whose appointments are after yours.  Also, if you miss three or more appointments without notifying the office, you may be dismissed from the clinic at the provider's discretion.      For prescription refill requests, have your pharmacy contact our office and allow 72 hours for refills to be completed.    Today you received the following chemotherapy and/or immunotherapy agents FERAHEME      To help prevent nausea and vomiting after your treatment, we encourage you to take your nausea medication as directed.  BELOW ARE SYMPTOMS THAT SHOULD BE REPORTED IMMEDIATELY: *FEVER GREATER THAN 100.4 F (38 C) OR HIGHER *CHILLS OR SWEATING *NAUSEA AND VOMITING THAT IS NOT CONTROLLED WITH YOUR NAUSEA MEDICATION *UNUSUAL SHORTNESS OF BREATH *UNUSUAL BRUISING OR BLEEDING *URINARY PROBLEMS (pain or burning when urinating, or frequent urination) *BOWEL PROBLEMS (unusual diarrhea, constipation, pain near the anus) TENDERNESS IN MOUTH AND THROAT WITH OR WITHOUT PRESENCE OF ULCERS (sore throat, sores in mouth, or a toothache) UNUSUAL RASH, SWELLING OR PAIN  UNUSUAL VAGINAL DISCHARGE OR ITCHING   Items with * indicate a potential emergency and should be followed up as soon as possible or go to the Emergency Department if any problems should occur.  Please show the CHEMOTHERAPY ALERT CARD or IMMUNOTHERAPY ALERT CARD at check-in to  the Emergency Department and triage nurse.  Should you have questions after your visit or need to cancel or reschedule your appointment, please contact [facility] [place] [facility] [place]  [phone] and follow the prompts.  Office hours are 8:00 a.m. to 4:30 p.m. Monday - Friday. Please note that voicemails left after 4:00 p.m. may not be returned until the following business day.  We are closed weekends and major holidays. You have access to a nurse at all times for urgent questions. Please call the main number to the clinic [phone] and follow the prompts.  For any non-urgent questions, you may also contact your provider using MyChart. We now offer e-Visits for anyone 75 and older to request care online for non-urgent symptoms. For details visit [email or web address].   Also download the MyChart app! Go to the app store, search "MyChart", open the app, select [facility], and log in with your MyChart username and password.  Masks are optional in the cancer centers. If you would like for your care team to wear a mask while they are taking care of you, please let them know. For doctor visits, patients may have with them one support person who is at least 67 years old. At this time, visitors are not allowed in the infusion area.   Ferumoxytol Injection What is this medication? FERUMOXYTOL (FER ue MOX i tol) treats low levels of iron in your body (iron deficiency anemia). Iron is a mineral that plays an important role in making red blood cells, which carry oxygen from your lungs to the rest of your body. This medicine may be used for  other purposes; ask your health care provider or pharmacist if you have questions. COMMON BRAND NAME(S): Feraheme What should I tell my care team before I take this medication? They need to know if you have any of these conditions: Anemia not caused by low iron levels High levels of iron in the blood Magnetic resonance imaging (MRI) test scheduled An  unusual or allergic reaction to iron, other medications, foods, dyes, or preservatives Pregnant or trying to get pregnant Breast-feeding How should I use this medication? This medication is for injection into a vein. It is given in a hospital or clinic setting. Talk to your care team the use of this medication in children. Special care may be needed. Overdosage: If you think you have taken too much of this medicine contact a poison control center or emergency room at once. NOTE: This medicine is only for you. Do not share this medicine with others. What if I miss a dose? It is important not to miss your dose. Call your care team if you are unable to keep an appointment. What may interact with this medication? Other iron products This list may not describe all possible interactions. Give your health care provider a list of all the medicines, herbs, non-prescription drugs, or dietary supplements you use. Also tell them if you smoke, drink alcohol, or use illegal drugs. Some items may interact with your medicine. What should I watch for while using this medication? Visit your care team regularly. Tell your care team if your symptoms do not start to get better or if they get worse. You may need blood work done while you are taking this medication. You may need to follow a special diet. Talk to your care team. Foods that contain iron include: whole grains/cereals, dried fruits, beans, or peas, leafy green vegetables, and organ meats (liver, kidney). What side effects may I notice from receiving this medication? Side effects that you should report to your care team as soon as possible: Allergic reactions--skin rash, itching, hives, swelling of the face, lips, tongue, or throat Low blood pressure--dizziness, feeling faint or lightheaded, blurry vision Shortness of breath Side effects that usually do not require medical attention (report to your care team if they continue or are  bothersome): Flushing Headache Joint pain Muscle pain Nausea Pain, redness, or irritation at injection site This list may not describe all possible side effects. Call your doctor for medical advice about side effects. You may report side effects to FDA at 1-800-FDA-1088. Where should I keep my medication? This medication is given in a hospital or clinic and will not be stored at home. NOTE: This sheet is a summary. It may not cover all possible information. If you have questions about this medicine, talk to your doctor, pharmacist, or health care provider.  2023 Elsevier/Gold Standard (2020-05-20 00:00:00)

## 2021-10-20 ENCOUNTER — Inpatient Hospital Stay: Payer: Medicare Other | Attending: Nurse Practitioner

## 2021-10-20 DIAGNOSIS — D509 Iron deficiency anemia, unspecified: Secondary | ICD-10-CM | POA: Insufficient documentation

## 2021-10-20 DIAGNOSIS — Z79899 Other long term (current) drug therapy: Secondary | ICD-10-CM | POA: Insufficient documentation

## 2021-10-20 DIAGNOSIS — D508 Other iron deficiency anemias: Secondary | ICD-10-CM

## 2021-10-20 LAB — CBC WITH DIFFERENTIAL/PLATELET
Abs Immature Granulocytes: 0.03 10*3/uL (ref 0.00–0.07)
Basophils Absolute: 0.1 10*3/uL (ref 0.0–0.1)
Basophils Relative: 1 %
Eosinophils Absolute: 0.2 10*3/uL (ref 0.0–0.5)
Eosinophils Relative: 2 %
HCT: 38.6 % (ref 36.0–46.0)
Hemoglobin: 12.2 g/dL (ref 12.0–15.0)
Immature Granulocytes: 0 %
Lymphocytes Relative: 23 %
Lymphs Abs: 2.2 10*3/uL (ref 0.7–4.0)
MCH: 27.1 pg (ref 26.0–34.0)
MCHC: 31.6 g/dL (ref 30.0–36.0)
MCV: 85.6 fL (ref 80.0–100.0)
Monocytes Absolute: 0.8 10*3/uL (ref 0.1–1.0)
Monocytes Relative: 9 %
Neutro Abs: 5.9 10*3/uL (ref 1.7–7.7)
Neutrophils Relative %: 65 %
Platelets: 345 10*3/uL (ref 150–400)
RBC: 4.51 MIL/uL (ref 3.87–5.11)
RDW: 16.1 % — ABNORMAL HIGH (ref 11.5–15.5)
WBC: 9.2 10*3/uL (ref 4.0–10.5)
nRBC: 0 % (ref 0.0–0.2)

## 2021-10-20 LAB — IRON AND TIBC
Iron: 43 ug/dL (ref 28–170)
Saturation Ratios: 10 % — ABNORMAL LOW (ref 10.4–31.8)
TIBC: 433 ug/dL (ref 250–450)
UIBC: 390 ug/dL

## 2021-10-20 LAB — FERRITIN: Ferritin: 21 ng/mL (ref 11–307)

## 2021-10-23 ENCOUNTER — Inpatient Hospital Stay (HOSPITAL_BASED_OUTPATIENT_CLINIC_OR_DEPARTMENT_OTHER): Payer: Medicare Other | Admitting: Oncology

## 2021-10-23 ENCOUNTER — Encounter: Payer: Self-pay | Admitting: Oncology

## 2021-10-23 ENCOUNTER — Inpatient Hospital Stay: Payer: Medicare Other

## 2021-10-23 VITALS — BP 137/56 | HR 71 | Temp 98.3°F | Resp 18 | Wt 294.3 lb

## 2021-10-23 DIAGNOSIS — D508 Other iron deficiency anemias: Secondary | ICD-10-CM

## 2021-10-23 DIAGNOSIS — K50013 Crohn's disease of small intestine with fistula: Secondary | ICD-10-CM

## 2021-10-23 DIAGNOSIS — D509 Iron deficiency anemia, unspecified: Secondary | ICD-10-CM | POA: Diagnosis not present

## 2021-10-23 MED ORDER — SODIUM CHLORIDE 0.9 % IV SOLN
510.0000 mg | Freq: Once | INTRAVENOUS | Status: AC
  Administered 2021-10-23: 510 mg via INTRAVENOUS
  Filled 2021-10-23: qty 510

## 2021-10-23 MED ORDER — SODIUM CHLORIDE 0.9 % IV SOLN
INTRAVENOUS | Status: DC
  Filled 2021-10-23: qty 250

## 2021-10-23 NOTE — Progress Notes (Signed)
Pt here for follow up. Reports she had been having problems with J pouch but is being followed up by GI for these issues.

## 2021-10-23 NOTE — Assessment & Plan Note (Signed)
Encourage patient to follow-up with gastroenterology.

## 2021-10-23 NOTE — Assessment & Plan Note (Signed)
in the context of Crohn's disease, total colectomy with malabsorption. Labs reviewed and discussed with patient.  Hemoglobin is decreased  Iron panel is consistent with iron deficient Recommend IV Feraheme weekly x1

## 2021-10-23 NOTE — Progress Notes (Signed)
Hematology/Oncology Progress note [phone] [phone]      Clinic Day:  10/23/2021  ASSESSMENT & PLAN:  Iron deficiency anemia in the context of Crohn's disease, total colectomy with malabsorption. Labs reviewed and discussed with patient.  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  Orders Placed This Encounter  Procedures   CBC with Differential/Platelet    Standing Status:   Future    Standing Expiration Date:   10/24/2022   Ferritin    Standing Status:   Future    Standing Expiration Date:   10/24/2022   Iron and TIBC    Standing Status:   Future    Standing Expiration Date:   10/24/2022   I discussed the assessment and treatment plan with the patient.  The patient was provided an opportunity to ask questions and all were answered.  The patient agreed with the plan and demonstrated an understanding of the instructions.  The patient was advised to call back if the symptoms worsen or if the condition fails to improve as anticipated.  Follow-up plan 4 months lab prior MD + Feraheme. Iron labs   Earlie Server, MD, PhD [facility] Hematology Oncology 10/23/2021    Chief Complaint: Donna Hart is a 66 y.o. female with Crohn's disease and iron deficiency anemia presents for follow-up. HPI: Patient presents for follow-up of iron deficiency anemia.Patient previously followed up by Dr.Corcoran, patient switched care to me on 12/17/20 Extensive medical record review was performed by me  Patient has history of Crohn's disease and recurrent iron deficiency anemia.  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  Pouchoscopy at [facility] on 01/22/2018 revealed anal stricture on digital rectal exam.  Prepouch ileum  was normal.  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  There was inflammation at the pouch inlet secondary to pouchitis.  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 There were few shallow ulcers in the body of the pouch. There was a 4 cm segment of moderate inflammation in the pre-pouch ileum just above the pouch inlet (this looked worse than prior    She is on periodic IV Feraheme treatments for maintenance.  She tolerates Feraheme well without side effects.  Patient reports remote infusion reaction to a different brand of IV iron infusion at an outside facility. Today patient reports feeling well.  Denies any nausea vomiting diarrhea.  Denies hematochezia.  + Crohn's disease, due to insurance coverage/co-pay, patient has been off Stelara .  She has a J-pouch, not able to tell whether she has blood in the stool or not.  INTERVAL HISTORY Donna Hart is a 66 y.o. female who has above history reviewed by me today presents for follow up visit for management of iron deficiency anemia  10/06/21 endoscopy at [facility] showed pouchitis.  + fatigue. No new complaint.    Past Medical History:  Diagnosis Date   Cancer ([place])    thyroid   Crohn's disease ([place])    IDA (iron deficiency anemia)    Multinodular goiter     Past Surgical History:  Procedure Laterality Date   CESAREAN SECTION      Family History  Problem Relation Age of Onset   Breast cancer Maternal Aunt 2  Her father was suspected to have an autoimmune disease. Her sister has ulcerative  colitis.    Social History:  reports that she has never smoked. She has never used smokeless tobacco. She reports that she does not currently use alcohol. She reports that she does not use drugs.  .  Allergies:  Allergies  Allergen Reactions   Levofloxacin Other (See Comments)    Achilles tendon pain   Ciprofloxacin Palpitations    Heart Palpitations   Morphine Rash    Current Medications: Current  Outpatient Medications  Medication Sig Dispense Refill   amLODipine (NORVASC) 5 MG tablet Take 1 tablet by mouth daily.  11   BIOTIN PO Take 1 tablet by mouth daily.     Calcium Carbonate-Vitamin D 600-400 MG-UNIT tablet Take 1 tablet by mouth daily.      Celecoxib (CELEBREX PO) Take by mouth.     levothyroxine (SYNTHROID) 137 MCG tablet Take 137 mcg by mouth daily.     lisinopril (PRINIVIL,ZESTRIL) 10 MG tablet Take 10 mg by mouth daily.      Multiple Vitamins-Minerals (MULTIVITAMIN ADULT PO) Take 1 tablet by mouth daily.     omeprazole (PRILOSEC) 20 MG capsule Take 20 mg by mouth daily.     Ustekinumab (STELARA [place]) Inject 90 mg into the skin every 8 (eight) weeks.     VITAMIN D PO Take 1 tablet by mouth daily.     No current facility-administered medications for this visit.   Review of Systems  Constitutional:  Positive for fatigue. Negative for appetite change, chills and fever.  HENT:   Negative for hearing loss and voice change.   Eyes:  Negative for eye problems.  Respiratory:  Negative for chest tightness and cough.   Cardiovascular:  Negative for chest pain.  Gastrointestinal:  Negative for abdominal distention, abdominal pain and blood in stool.       Status post J-pouch surgery in 2003  Endocrine: Negative for hot flashes.  Genitourinary:  Negative for difficulty urinating and frequency.   Musculoskeletal:  Positive for arthralgias.  Skin:  Negative for itching and rash.  Neurological:  Negative for extremity weakness.  Hematological:  Negative for adenopathy.  Psychiatric/Behavioral:  Negative for confusion.     Performance status (ECOG): 1  Vitals Blood pressure (!) 137/56, pulse 71, temperature 98.3 F (36.8 C), resp. rate 18, weight 294 lb 4.8 oz (133.5 kg).  Physical Exam Constitutional:      General: She is not in acute distress.    Appearance: She is obese. She is not diaphoretic.  HENT:     Mouth/Throat:     Pharynx: No oropharyngeal exudate.  Eyes:      General: No scleral icterus. Cardiovascular:     Rate and Rhythm: Normal rate.     Heart sounds: No murmur heard. Pulmonary:     Effort: Pulmonary effort is normal. No respiratory distress.  Abdominal:     Palpations: Abdomen is soft.     Comments: J-pouch  Musculoskeletal:        General: Normal range of motion.     Cervical back: Normal range of motion.  Skin:    General: Skin is warm and dry.     Findings: No erythema.  Neurological:     Mental Status: She is alert and oriented to person, place, and time.     Cranial Nerves: No cranial nerve deficit.     Motor: No abnormal muscle tone.     Coordination: Coordination normal.  Psychiatric:        Mood and Affect: Mood and affect normal.  Labs    Latest Ref Rng & Units 10/20/2021    1:24 PM 08/21/2021    1:17 PM 06/19/2021    1:35 PM  CBC  WBC 4.0 - 10.5 K/uL 9.2  9.0  8.9   Hemoglobin 12.0 - 15.0 g/dL 12.2  12.1  11.3   Hematocrit 36.0 - 46.0 % 38.6  39.0  36.9   Platelets 150 - 400 K/uL 345  371  335        No data to display         Lab Results  Component Value Date   IRON 43 10/20/2021   TIBC 433 10/20/2021   FERRITIN 21 10/20/2021

## 2021-12-14 ENCOUNTER — Other Ambulatory Visit: Payer: Medicare Other

## 2021-12-15 ENCOUNTER — Ambulatory Visit: Payer: Medicare Other | Admitting: Oncology

## 2021-12-15 ENCOUNTER — Ambulatory Visit: Payer: Medicare Other

## 2022-02-23 ENCOUNTER — Other Ambulatory Visit: Payer: Medicare Other

## 2022-02-26 ENCOUNTER — Ambulatory Visit: Payer: Medicare Other

## 2022-02-26 ENCOUNTER — Ambulatory Visit: Payer: Medicare Other | Admitting: Oncology

## 2022-03-20 IMAGING — MG MM DIGITAL SCREENING BILAT W/ TOMO AND CAD
8 of 15 series · 8 of 40 positions shown · non-contrast
Comparison: Previous exam(s).

CLINICAL DATA: Screening.

EXAM:
DIGITAL SCREENING BILATERAL MAMMOGRAM WITH TOMOSYNTHESIS AND CAD
TECHNIQUE: Bilateral screening digital craniocaudal and mediolateral oblique
mammograms were obtained. Bilateral screening digital breast
tomosynthesis was performed. The images were evaluated with
computer-aided detection.

[L MLO synth-2D (1 of 2)]
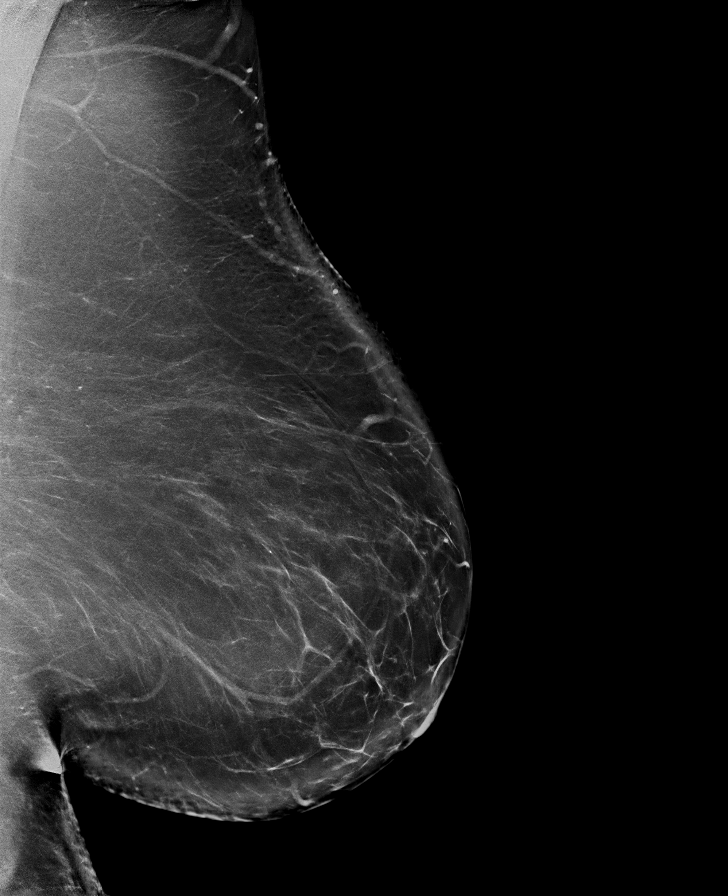

[R MLO synth-2D (1 of 2)]
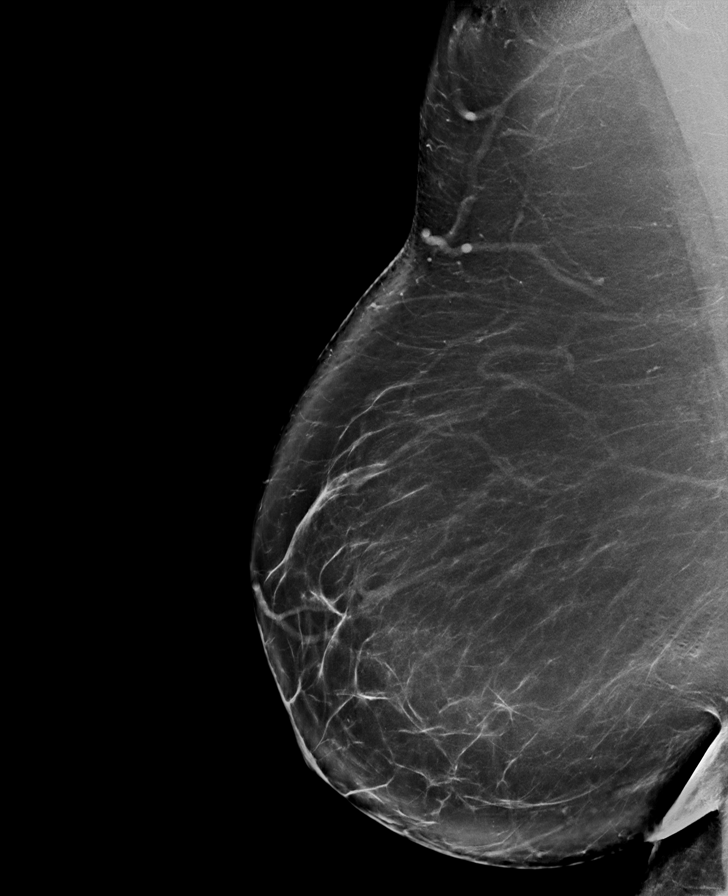

[L MLO synth-2D (2 of 2)]
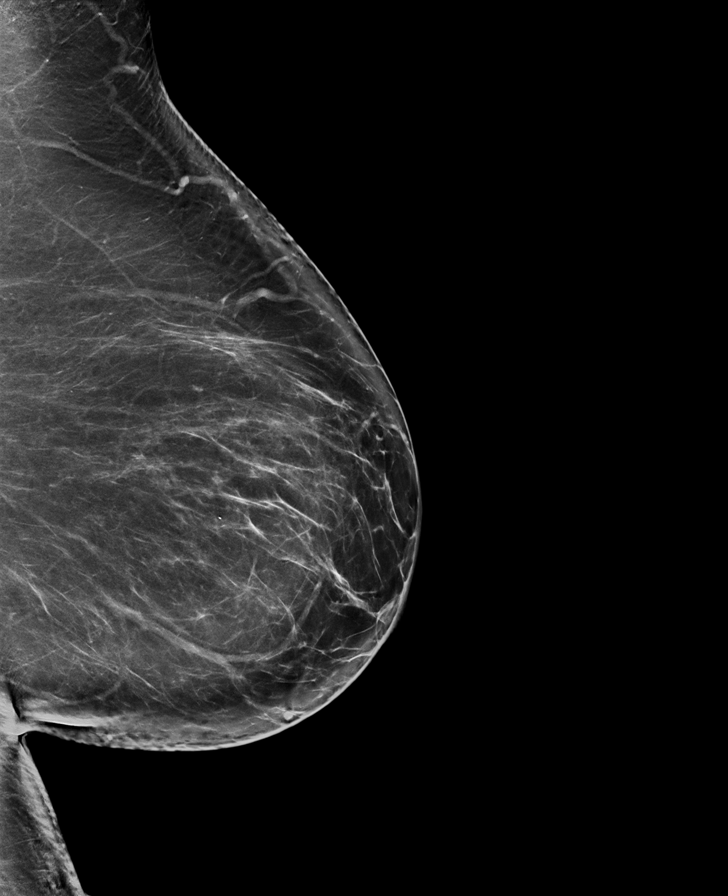

[R MLO synth-2D (2 of 2)]
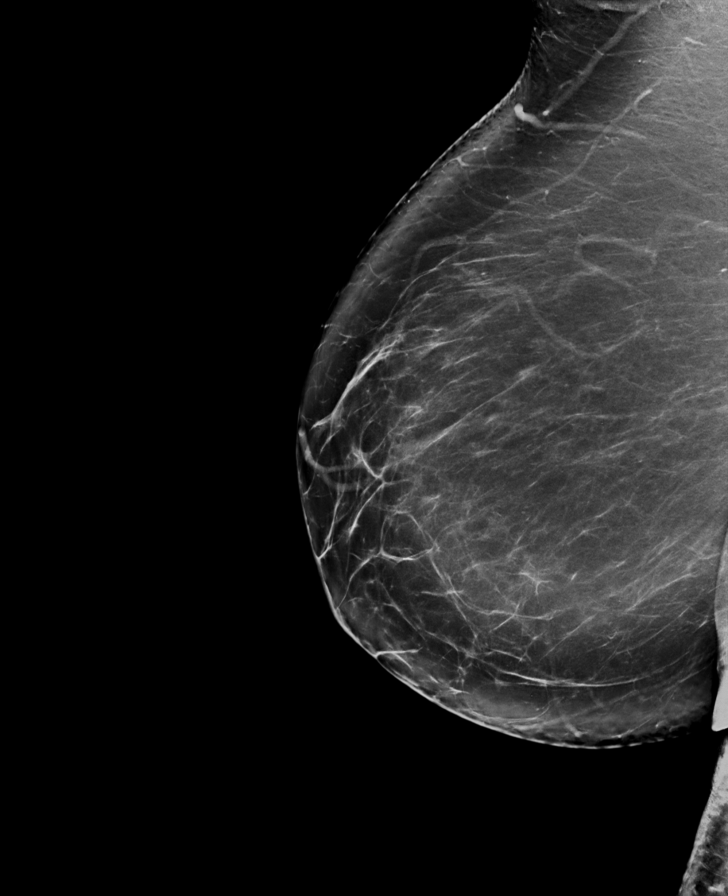

[R CC synth-2D (1 of 2)]
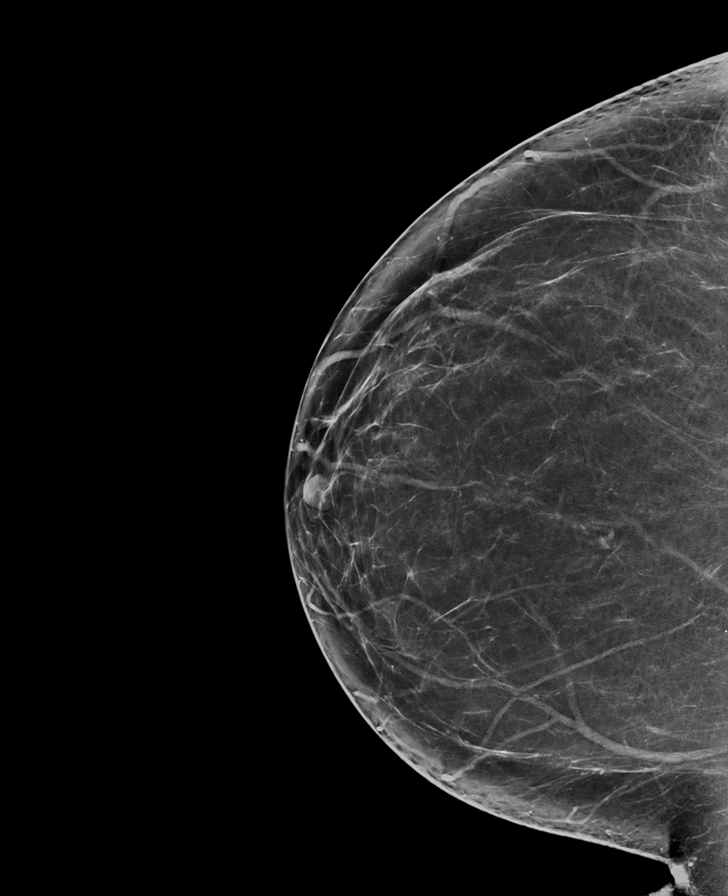

[L CC synth-2D]
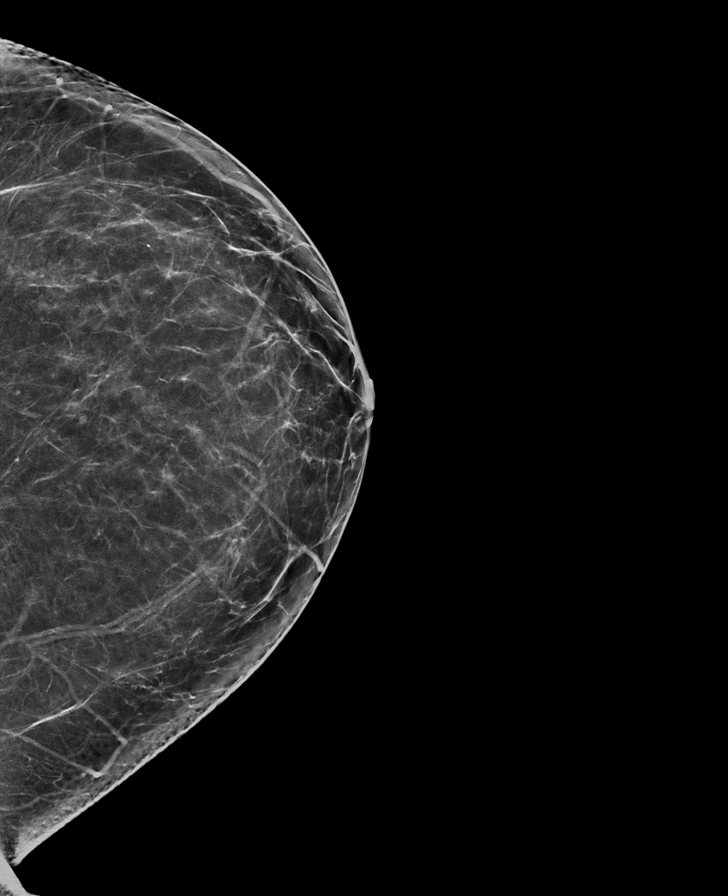

[R CC synth-2D (2 of 2)]
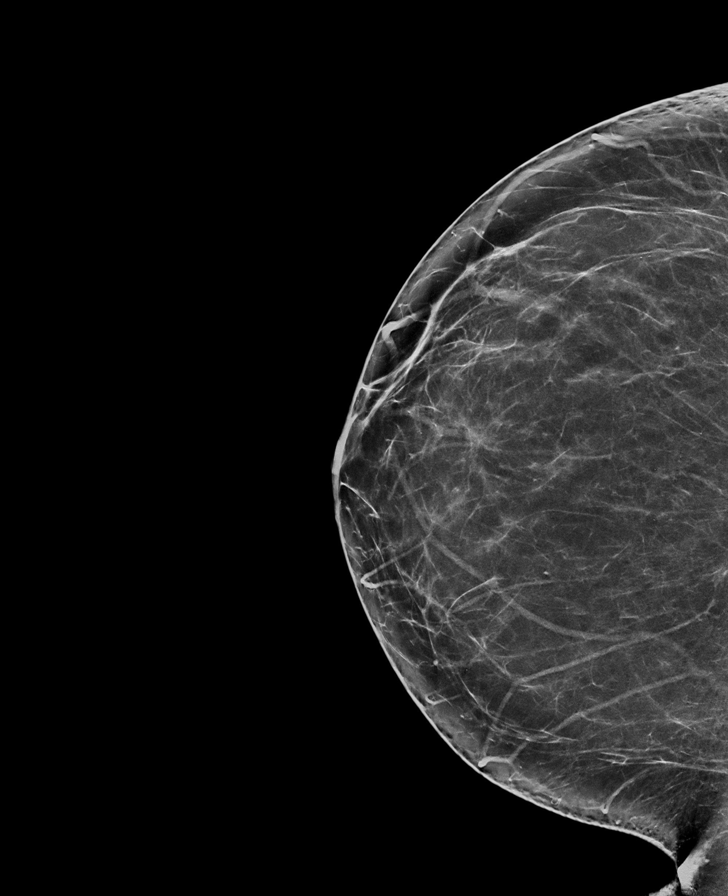

[L CC tomo · tomo slice 55/80.0]
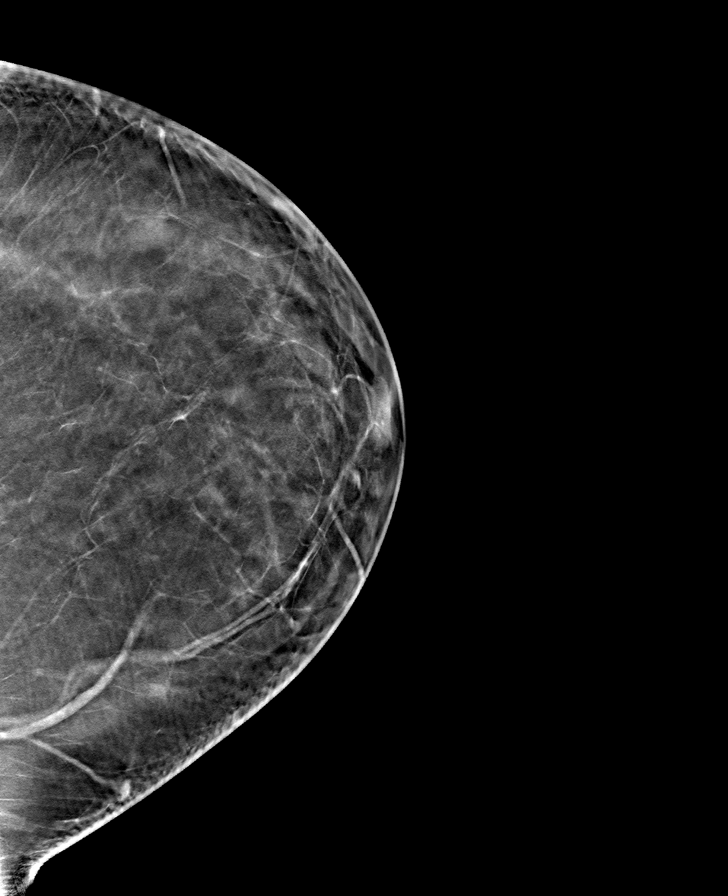

[8 of 40 positions shown; findings below may reference images not displayed]

ACR Breast Density Category b: There are scattered areas of
fibroglandular density.
FINDINGS: There are no findings suspicious for malignancy.
IMPRESSION: No mammographic evidence of malignancy. A result letter of this
screening mammogram will be mailed directly to the patient.

RECOMMENDATION:
Screening mammogram in one year. (Code:51-O-LD2)

BI-RADS CATEGORY  1: Negative.

## 2022-07-23 ENCOUNTER — Other Ambulatory Visit: Payer: Self-pay | Admitting: Family Medicine

## 2022-07-23 DIAGNOSIS — Z1231 Encounter for screening mammogram for malignant neoplasm of breast: Secondary | ICD-10-CM

## 2022-07-23 DIAGNOSIS — Z780 Asymptomatic menopausal state: Secondary | ICD-10-CM

## 2022-07-31 ENCOUNTER — Encounter: Payer: Self-pay | Admitting: Oncology

## 2022-08-16 ENCOUNTER — Encounter: Payer: Self-pay | Admitting: Oncology

## 2022-08-23 ENCOUNTER — Ambulatory Visit: Payer: Medicare Other

## 2022-09-13 ENCOUNTER — Ambulatory Visit
Admission: RE | Admit: 2022-09-13 | Discharge: 2022-09-13 | Disposition: A | Discharge status: Home / Self Care | Payer: Medicare Other | Source: Ambulatory Visit | Attending: Family Medicine | Admitting: Family Medicine

## 2022-09-13 DIAGNOSIS — Z1231 Encounter for screening mammogram for malignant neoplasm of breast: Secondary | ICD-10-CM | POA: Diagnosis present
